# Patient Record
Sex: Female | Born: 1960
Health system: Southern US, Community
[De-identification: ages and names within clinical notes are randomized; demographics above are authoritative.]

## PROBLEM LIST (undated history)

## (undated) DIAGNOSIS — J349 Unspecified disorder of nose and nasal sinuses: Secondary | ICD-10-CM

## (undated) DIAGNOSIS — H9190 Unspecified hearing loss, unspecified ear: Secondary | ICD-10-CM

## (undated) DIAGNOSIS — F419 Anxiety disorder, unspecified: Secondary | ICD-10-CM

## (undated) DIAGNOSIS — G809 Cerebral palsy, unspecified: Secondary | ICD-10-CM

## (undated) DIAGNOSIS — F32A Depression, unspecified: Secondary | ICD-10-CM

## (undated) DIAGNOSIS — F329 Major depressive disorder, single episode, unspecified: Secondary | ICD-10-CM

## (undated) HISTORY — DX: Unspecified hearing loss, unspecified ear: H91.90

## (undated) HISTORY — PX: BRAIN SURGERY: SHX531

## (undated) HISTORY — DX: Unspecified disorder of nose and nasal sinuses: J34.9

---

## 2006-03-31 ENCOUNTER — Encounter: Admission: RE | Admit: 2006-03-31 | Discharge: 2006-03-31 | Payer: Self-pay | Admitting: Family Medicine

## 2007-04-03 ENCOUNTER — Encounter: Admission: RE | Admit: 2007-04-03 | Discharge: 2007-04-03 | Payer: Self-pay | Admitting: Family Medicine

## 2007-08-09 ENCOUNTER — Emergency Department (HOSPITAL_COMMUNITY): Admission: EM | Admit: 2007-08-09 | Discharge: 2007-08-09 | Payer: Self-pay | Admitting: Emergency Medicine

## 2007-09-06 ENCOUNTER — Encounter: Admission: RE | Admit: 2007-09-06 | Discharge: 2007-09-06 | Payer: Self-pay | Admitting: Family Medicine

## 2008-04-08 ENCOUNTER — Encounter: Admission: RE | Admit: 2008-04-08 | Discharge: 2008-04-08 | Payer: Self-pay | Admitting: Family Medicine

## 2009-04-21 ENCOUNTER — Encounter: Admission: RE | Admit: 2009-04-21 | Discharge: 2009-04-21 | Payer: Self-pay | Admitting: Internal Medicine

## 2009-04-27 ENCOUNTER — Encounter: Admission: RE | Admit: 2009-04-27 | Discharge: 2009-04-27 | Payer: Self-pay | Admitting: Internal Medicine

## 2010-04-30 ENCOUNTER — Encounter
Admission: RE | Admit: 2010-04-30 | Discharge: 2010-04-30 | Payer: Self-pay | Source: Home / Self Care | Attending: Family Medicine | Admitting: Family Medicine

## 2010-05-09 ENCOUNTER — Encounter: Payer: Self-pay | Admitting: Internal Medicine

## 2011-04-08 ENCOUNTER — Other Ambulatory Visit: Payer: Self-pay | Admitting: Family Medicine

## 2011-04-08 DIAGNOSIS — Z1231 Encounter for screening mammogram for malignant neoplasm of breast: Secondary | ICD-10-CM

## 2011-05-02 ENCOUNTER — Ambulatory Visit
Admission: RE | Admit: 2011-05-02 | Discharge: 2011-05-02 | Disposition: A | Discharge status: Home / Self Care | Payer: Medicare Other | Source: Ambulatory Visit | Attending: Family Medicine | Admitting: Family Medicine

## 2011-05-02 DIAGNOSIS — Z1231 Encounter for screening mammogram for malignant neoplasm of breast: Secondary | ICD-10-CM | POA: Diagnosis not present

## 2011-05-17 DIAGNOSIS — B351 Tinea unguium: Secondary | ICD-10-CM | POA: Diagnosis not present

## 2011-06-27 DIAGNOSIS — H906 Mixed conductive and sensorineural hearing loss, bilateral: Secondary | ICD-10-CM | POA: Diagnosis not present

## 2011-08-19 DIAGNOSIS — B351 Tinea unguium: Secondary | ICD-10-CM | POA: Diagnosis not present

## 2011-08-19 DIAGNOSIS — M79609 Pain in unspecified limb: Secondary | ICD-10-CM | POA: Diagnosis not present

## 2011-08-29 DIAGNOSIS — D239 Other benign neoplasm of skin, unspecified: Secondary | ICD-10-CM | POA: Diagnosis not present

## 2011-08-29 DIAGNOSIS — E781 Pure hyperglyceridemia: Secondary | ICD-10-CM | POA: Diagnosis not present

## 2011-08-29 DIAGNOSIS — Z23 Encounter for immunization: Secondary | ICD-10-CM | POA: Diagnosis not present

## 2011-08-29 DIAGNOSIS — L259 Unspecified contact dermatitis, unspecified cause: Secondary | ICD-10-CM | POA: Diagnosis not present

## 2011-10-05 DIAGNOSIS — T148XXA Other injury of unspecified body region, initial encounter: Secondary | ICD-10-CM | POA: Diagnosis not present

## 2011-10-05 DIAGNOSIS — B353 Tinea pedis: Secondary | ICD-10-CM | POA: Diagnosis not present

## 2011-11-18 DIAGNOSIS — B351 Tinea unguium: Secondary | ICD-10-CM | POA: Diagnosis not present

## 2011-11-18 DIAGNOSIS — M79609 Pain in unspecified limb: Secondary | ICD-10-CM | POA: Diagnosis not present

## 2012-02-10 DIAGNOSIS — K5900 Constipation, unspecified: Secondary | ICD-10-CM | POA: Diagnosis not present

## 2012-02-10 DIAGNOSIS — Z23 Encounter for immunization: Secondary | ICD-10-CM | POA: Diagnosis not present

## 2012-02-10 DIAGNOSIS — J309 Allergic rhinitis, unspecified: Secondary | ICD-10-CM | POA: Diagnosis not present

## 2012-02-24 DIAGNOSIS — B351 Tinea unguium: Secondary | ICD-10-CM | POA: Diagnosis not present

## 2012-02-24 DIAGNOSIS — M79609 Pain in unspecified limb: Secondary | ICD-10-CM | POA: Diagnosis not present

## 2012-03-27 DIAGNOSIS — H60399 Other infective otitis externa, unspecified ear: Secondary | ICD-10-CM | POA: Diagnosis not present

## 2012-04-03 ENCOUNTER — Other Ambulatory Visit: Payer: Self-pay | Admitting: Family Medicine

## 2012-04-03 DIAGNOSIS — Z1231 Encounter for screening mammogram for malignant neoplasm of breast: Secondary | ICD-10-CM

## 2012-04-20 DIAGNOSIS — E781 Pure hyperglyceridemia: Secondary | ICD-10-CM | POA: Diagnosis not present

## 2012-04-20 DIAGNOSIS — Z0000 Encounter for general adult medical examination without abnormal findings: Secondary | ICD-10-CM | POA: Diagnosis not present

## 2012-04-20 DIAGNOSIS — Z5189 Encounter for other specified aftercare: Secondary | ICD-10-CM | POA: Diagnosis not present

## 2012-04-20 DIAGNOSIS — F411 Generalized anxiety disorder: Secondary | ICD-10-CM | POA: Diagnosis not present

## 2012-04-20 DIAGNOSIS — G809 Cerebral palsy, unspecified: Secondary | ICD-10-CM | POA: Diagnosis not present

## 2012-05-01 DIAGNOSIS — Z5189 Encounter for other specified aftercare: Secondary | ICD-10-CM | POA: Diagnosis not present

## 2012-05-01 DIAGNOSIS — E781 Pure hyperglyceridemia: Secondary | ICD-10-CM | POA: Diagnosis not present

## 2012-05-01 DIAGNOSIS — Z0000 Encounter for general adult medical examination without abnormal findings: Secondary | ICD-10-CM | POA: Diagnosis not present

## 2012-05-10 ENCOUNTER — Ambulatory Visit
Admission: RE | Admit: 2012-05-10 | Discharge: 2012-05-10 | Disposition: A | Discharge status: Home / Self Care | Payer: Medicare Other | Source: Ambulatory Visit | Attending: Family Medicine | Admitting: Family Medicine

## 2012-05-10 DIAGNOSIS — Z1231 Encounter for screening mammogram for malignant neoplasm of breast: Secondary | ICD-10-CM | POA: Diagnosis not present

## 2012-05-28 DIAGNOSIS — H109 Unspecified conjunctivitis: Secondary | ICD-10-CM | POA: Diagnosis not present

## 2012-05-28 DIAGNOSIS — L259 Unspecified contact dermatitis, unspecified cause: Secondary | ICD-10-CM | POA: Diagnosis not present

## 2012-06-08 DIAGNOSIS — G809 Cerebral palsy, unspecified: Secondary | ICD-10-CM | POA: Diagnosis not present

## 2012-06-08 DIAGNOSIS — F411 Generalized anxiety disorder: Secondary | ICD-10-CM | POA: Diagnosis not present

## 2012-06-12 DIAGNOSIS — B351 Tinea unguium: Secondary | ICD-10-CM | POA: Diagnosis not present

## 2012-06-12 DIAGNOSIS — M79609 Pain in unspecified limb: Secondary | ICD-10-CM | POA: Diagnosis not present

## 2012-09-25 DIAGNOSIS — H906 Mixed conductive and sensorineural hearing loss, bilateral: Secondary | ICD-10-CM | POA: Diagnosis not present

## 2012-09-25 DIAGNOSIS — H6120 Impacted cerumen, unspecified ear: Secondary | ICD-10-CM | POA: Diagnosis not present

## 2012-10-02 DIAGNOSIS — M79609 Pain in unspecified limb: Secondary | ICD-10-CM | POA: Diagnosis not present

## 2012-10-02 DIAGNOSIS — B351 Tinea unguium: Secondary | ICD-10-CM | POA: Diagnosis not present

## 2012-10-30 DIAGNOSIS — Z13220 Encounter for screening for lipoid disorders: Secondary | ICD-10-CM | POA: Diagnosis not present

## 2012-10-30 DIAGNOSIS — Z124 Encounter for screening for malignant neoplasm of cervix: Secondary | ICD-10-CM | POA: Diagnosis not present

## 2012-10-31 DIAGNOSIS — H109 Unspecified conjunctivitis: Secondary | ICD-10-CM | POA: Diagnosis not present

## 2012-10-31 DIAGNOSIS — H04559 Acquired stenosis of unspecified nasolacrimal duct: Secondary | ICD-10-CM | POA: Diagnosis not present

## 2013-01-01 DIAGNOSIS — B351 Tinea unguium: Secondary | ICD-10-CM | POA: Diagnosis not present

## 2013-01-01 DIAGNOSIS — M79609 Pain in unspecified limb: Secondary | ICD-10-CM | POA: Diagnosis not present

## 2013-01-02 ENCOUNTER — Other Ambulatory Visit: Payer: Self-pay

## 2013-01-02 DIAGNOSIS — Z1231 Encounter for screening mammogram for malignant neoplasm of breast: Secondary | ICD-10-CM

## 2013-02-06 DIAGNOSIS — Z23 Encounter for immunization: Secondary | ICD-10-CM | POA: Diagnosis not present

## 2013-04-02 ENCOUNTER — Ambulatory Visit: Payer: Medicare Other

## 2013-04-30 ENCOUNTER — Ambulatory Visit (INDEPENDENT_AMBULATORY_CARE_PROVIDER_SITE_OTHER): Payer: Medicare Other

## 2013-04-30 VITALS — BP 138/95 | HR 95 | Resp 16 | Ht <= 58 in

## 2013-04-30 DIAGNOSIS — B351 Tinea unguium: Secondary | ICD-10-CM

## 2013-04-30 DIAGNOSIS — G629 Polyneuropathy, unspecified: Secondary | ICD-10-CM

## 2013-04-30 DIAGNOSIS — G609 Hereditary and idiopathic neuropathy, unspecified: Secondary | ICD-10-CM | POA: Diagnosis not present

## 2013-04-30 DIAGNOSIS — M79609 Pain in unspecified limb: Secondary | ICD-10-CM | POA: Diagnosis not present

## 2013-04-30 DIAGNOSIS — S90129A Contusion of unspecified lesser toe(s) without damage to nail, initial encounter: Secondary | ICD-10-CM

## 2013-04-30 DIAGNOSIS — M2040 Other hammer toe(s) (acquired), unspecified foot: Secondary | ICD-10-CM

## 2013-04-30 NOTE — Patient Instructions (Signed)

## 2013-04-30 NOTE — Progress Notes (Signed)
   Subjective:    Patient ID: Tamara Curry, female    DOB: 12/26/1960, 53 y.o.   MRN: [id number]  HPI Comments: i got a toenail that is coming off on my right foot, its the 4th toenail.     Review of Systems no new changes rotates at this time. 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 Patient does have spasms and tremors of both lower extremities is nonambulatory and wheelchair. Nails somewhat thick brittle discolored and friable 1 through 5 bilateral however more significant fourth digit right foot demonstrates an separation from the nail bed or contusion to the toe the nail plate is been almost completely ripped loose from the nailbed on the fourth toe there is some dried blood no active discharge drainage no purulence no ascending psoas lymphangitis is noted. Remaining nails thickened yellow brittle discolored friable consistent with onychomycosis.       Assessment & Plan:  Assessment this time is contusion of toe and nail plate fourth right as well as onychomycosis proptosis of remaining nails 1 through 5 bilateral. Painful mycotic nails debrided 1 through 5 bilateral and partial avulsion of the fourth toe is is done as nails completely separate from the nailbed. Is debrided cleansed with all cleansed Neosporin and Band-Aid dressing applied to the fourth toe right foot. Recommended palliative care in the future and as-needed basis suggest to 3 month followup for nail care in the future also recommended loosefitting slippers or  sandals consider things like a UGGS a booster sandals to accommodate the foot and protecting keep it warm she does not need a functional shoes she is not a candidate for surgical intervention for correction of bunion and hammertoes based on her neuropathy and paresthesias. Reappointed future palliative care is needed  Harriet Masson DPM

## 2013-05-13 ENCOUNTER — Ambulatory Visit: Payer: Medicare Other

## 2013-05-13 DIAGNOSIS — F411 Generalized anxiety disorder: Secondary | ICD-10-CM | POA: Diagnosis not present

## 2013-05-13 DIAGNOSIS — E781 Pure hyperglyceridemia: Secondary | ICD-10-CM | POA: Diagnosis not present

## 2013-05-13 DIAGNOSIS — Z79899 Other long term (current) drug therapy: Secondary | ICD-10-CM | POA: Diagnosis not present

## 2013-05-13 DIAGNOSIS — Z0000 Encounter for general adult medical examination without abnormal findings: Secondary | ICD-10-CM | POA: Diagnosis not present

## 2013-05-13 DIAGNOSIS — G809 Cerebral palsy, unspecified: Secondary | ICD-10-CM | POA: Diagnosis not present

## 2013-05-16 DIAGNOSIS — F411 Generalized anxiety disorder: Secondary | ICD-10-CM | POA: Diagnosis not present

## 2013-05-16 DIAGNOSIS — Z79899 Other long term (current) drug therapy: Secondary | ICD-10-CM | POA: Diagnosis not present

## 2013-05-16 DIAGNOSIS — R269 Unspecified abnormalities of gait and mobility: Secondary | ICD-10-CM | POA: Diagnosis not present

## 2013-05-16 DIAGNOSIS — M6281 Muscle weakness (generalized): Secondary | ICD-10-CM | POA: Diagnosis not present

## 2013-05-16 DIAGNOSIS — G809 Cerebral palsy, unspecified: Secondary | ICD-10-CM | POA: Diagnosis not present

## 2013-05-16 DIAGNOSIS — Z5189 Encounter for other specified aftercare: Secondary | ICD-10-CM | POA: Diagnosis not present

## 2013-05-20 DIAGNOSIS — G809 Cerebral palsy, unspecified: Secondary | ICD-10-CM | POA: Diagnosis not present

## 2013-05-20 DIAGNOSIS — M6281 Muscle weakness (generalized): Secondary | ICD-10-CM | POA: Diagnosis not present

## 2013-05-20 DIAGNOSIS — F411 Generalized anxiety disorder: Secondary | ICD-10-CM | POA: Diagnosis not present

## 2013-05-20 DIAGNOSIS — Z5189 Encounter for other specified aftercare: Secondary | ICD-10-CM | POA: Diagnosis not present

## 2013-05-20 DIAGNOSIS — Z79899 Other long term (current) drug therapy: Secondary | ICD-10-CM | POA: Diagnosis not present

## 2013-05-20 DIAGNOSIS — R269 Unspecified abnormalities of gait and mobility: Secondary | ICD-10-CM | POA: Diagnosis not present

## 2013-05-22 DIAGNOSIS — Z5189 Encounter for other specified aftercare: Secondary | ICD-10-CM | POA: Diagnosis not present

## 2013-05-22 DIAGNOSIS — F411 Generalized anxiety disorder: Secondary | ICD-10-CM | POA: Diagnosis not present

## 2013-05-22 DIAGNOSIS — R269 Unspecified abnormalities of gait and mobility: Secondary | ICD-10-CM | POA: Diagnosis not present

## 2013-05-22 DIAGNOSIS — Z79899 Other long term (current) drug therapy: Secondary | ICD-10-CM | POA: Diagnosis not present

## 2013-05-22 DIAGNOSIS — G809 Cerebral palsy, unspecified: Secondary | ICD-10-CM | POA: Diagnosis not present

## 2013-05-22 DIAGNOSIS — M6281 Muscle weakness (generalized): Secondary | ICD-10-CM | POA: Diagnosis not present

## 2013-05-24 DIAGNOSIS — Z79899 Other long term (current) drug therapy: Secondary | ICD-10-CM | POA: Diagnosis not present

## 2013-05-24 DIAGNOSIS — R269 Unspecified abnormalities of gait and mobility: Secondary | ICD-10-CM | POA: Diagnosis not present

## 2013-05-24 DIAGNOSIS — M6281 Muscle weakness (generalized): Secondary | ICD-10-CM | POA: Diagnosis not present

## 2013-05-24 DIAGNOSIS — Z5189 Encounter for other specified aftercare: Secondary | ICD-10-CM | POA: Diagnosis not present

## 2013-05-24 DIAGNOSIS — G809 Cerebral palsy, unspecified: Secondary | ICD-10-CM | POA: Diagnosis not present

## 2013-05-24 DIAGNOSIS — F411 Generalized anxiety disorder: Secondary | ICD-10-CM | POA: Diagnosis not present

## 2013-05-27 DIAGNOSIS — Z5189 Encounter for other specified aftercare: Secondary | ICD-10-CM | POA: Diagnosis not present

## 2013-05-27 DIAGNOSIS — F411 Generalized anxiety disorder: Secondary | ICD-10-CM | POA: Diagnosis not present

## 2013-05-27 DIAGNOSIS — M6281 Muscle weakness (generalized): Secondary | ICD-10-CM | POA: Diagnosis not present

## 2013-05-27 DIAGNOSIS — Z79899 Other long term (current) drug therapy: Secondary | ICD-10-CM | POA: Diagnosis not present

## 2013-05-27 DIAGNOSIS — R269 Unspecified abnormalities of gait and mobility: Secondary | ICD-10-CM | POA: Diagnosis not present

## 2013-05-27 DIAGNOSIS — G809 Cerebral palsy, unspecified: Secondary | ICD-10-CM | POA: Diagnosis not present

## 2013-05-30 DIAGNOSIS — M6281 Muscle weakness (generalized): Secondary | ICD-10-CM | POA: Diagnosis not present

## 2013-05-30 DIAGNOSIS — Z79899 Other long term (current) drug therapy: Secondary | ICD-10-CM | POA: Diagnosis not present

## 2013-05-30 DIAGNOSIS — Z5189 Encounter for other specified aftercare: Secondary | ICD-10-CM | POA: Diagnosis not present

## 2013-05-30 DIAGNOSIS — R269 Unspecified abnormalities of gait and mobility: Secondary | ICD-10-CM | POA: Diagnosis not present

## 2013-05-30 DIAGNOSIS — F411 Generalized anxiety disorder: Secondary | ICD-10-CM | POA: Diagnosis not present

## 2013-05-30 DIAGNOSIS — G809 Cerebral palsy, unspecified: Secondary | ICD-10-CM | POA: Diagnosis not present

## 2013-05-31 DIAGNOSIS — G809 Cerebral palsy, unspecified: Secondary | ICD-10-CM | POA: Diagnosis not present

## 2013-05-31 DIAGNOSIS — M6281 Muscle weakness (generalized): Secondary | ICD-10-CM | POA: Diagnosis not present

## 2013-05-31 DIAGNOSIS — Z5189 Encounter for other specified aftercare: Secondary | ICD-10-CM | POA: Diagnosis not present

## 2013-05-31 DIAGNOSIS — F411 Generalized anxiety disorder: Secondary | ICD-10-CM | POA: Diagnosis not present

## 2013-05-31 DIAGNOSIS — Z79899 Other long term (current) drug therapy: Secondary | ICD-10-CM | POA: Diagnosis not present

## 2013-05-31 DIAGNOSIS — R269 Unspecified abnormalities of gait and mobility: Secondary | ICD-10-CM | POA: Diagnosis not present

## 2013-06-06 DIAGNOSIS — R269 Unspecified abnormalities of gait and mobility: Secondary | ICD-10-CM | POA: Diagnosis not present

## 2013-06-06 DIAGNOSIS — F411 Generalized anxiety disorder: Secondary | ICD-10-CM | POA: Diagnosis not present

## 2013-06-06 DIAGNOSIS — Z5189 Encounter for other specified aftercare: Secondary | ICD-10-CM | POA: Diagnosis not present

## 2013-06-06 DIAGNOSIS — M6281 Muscle weakness (generalized): Secondary | ICD-10-CM | POA: Diagnosis not present

## 2013-06-06 DIAGNOSIS — G809 Cerebral palsy, unspecified: Secondary | ICD-10-CM | POA: Diagnosis not present

## 2013-06-06 DIAGNOSIS — Z79899 Other long term (current) drug therapy: Secondary | ICD-10-CM | POA: Diagnosis not present

## 2013-06-07 DIAGNOSIS — Z5189 Encounter for other specified aftercare: Secondary | ICD-10-CM | POA: Diagnosis not present

## 2013-06-07 DIAGNOSIS — M6281 Muscle weakness (generalized): Secondary | ICD-10-CM | POA: Diagnosis not present

## 2013-06-07 DIAGNOSIS — F411 Generalized anxiety disorder: Secondary | ICD-10-CM | POA: Diagnosis not present

## 2013-06-07 DIAGNOSIS — G809 Cerebral palsy, unspecified: Secondary | ICD-10-CM | POA: Diagnosis not present

## 2013-06-07 DIAGNOSIS — Z79899 Other long term (current) drug therapy: Secondary | ICD-10-CM | POA: Diagnosis not present

## 2013-06-07 DIAGNOSIS — R269 Unspecified abnormalities of gait and mobility: Secondary | ICD-10-CM | POA: Diagnosis not present

## 2013-06-24 ENCOUNTER — Ambulatory Visit
Admission: RE | Admit: 2013-06-24 | Discharge: 2013-06-24 | Disposition: A | Discharge status: Home / Self Care | Payer: Medicare Other | Source: Ambulatory Visit

## 2013-06-24 DIAGNOSIS — Z1231 Encounter for screening mammogram for malignant neoplasm of breast: Secondary | ICD-10-CM

## 2013-07-23 ENCOUNTER — Ambulatory Visit: Payer: Medicare Other

## 2013-07-31 DIAGNOSIS — F329 Major depressive disorder, single episode, unspecified: Secondary | ICD-10-CM | POA: Diagnosis not present

## 2013-07-31 DIAGNOSIS — F70 Mild intellectual disabilities: Secondary | ICD-10-CM | POA: Diagnosis not present

## 2013-07-31 DIAGNOSIS — F3289 Other specified depressive episodes: Secondary | ICD-10-CM | POA: Diagnosis not present

## 2013-07-31 DIAGNOSIS — F411 Generalized anxiety disorder: Secondary | ICD-10-CM | POA: Diagnosis not present

## 2013-08-27 ENCOUNTER — Ambulatory Visit (INDEPENDENT_AMBULATORY_CARE_PROVIDER_SITE_OTHER): Payer: Medicare Other

## 2013-08-27 VITALS — BP 122/78 | HR 75 | Resp 16

## 2013-08-27 DIAGNOSIS — B351 Tinea unguium: Secondary | ICD-10-CM | POA: Diagnosis not present

## 2013-08-27 DIAGNOSIS — G629 Polyneuropathy, unspecified: Secondary | ICD-10-CM

## 2013-08-27 DIAGNOSIS — G609 Hereditary and idiopathic neuropathy, unspecified: Secondary | ICD-10-CM

## 2013-08-27 DIAGNOSIS — M79609 Pain in unspecified limb: Secondary | ICD-10-CM | POA: Diagnosis not present

## 2013-08-27 NOTE — Progress Notes (Signed)
   Subjective:    Patient ID: Tamara Curry, female    DOB: 03-12-1961, 53 y.o.   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 Patient does have thick brittle discolored nails with ecchymosis of the left hallux nail bed due to the overlapping second toe sitting on top the nailbed. Nails somewhat loose and yellowed and friable brittle and discolored consistent with onychomycosis in kyphosis of nails.       Assessment & Plan:  Assessment onychomycosis the nails history of contusion the toes hallux left mini nails also some thickening yellowing discoloration and friability at this time painful mycotic nails debrided and presence of pain symptomatology in a history of contusions maintain accommodative comfortable shoes return in 3 months for continued mycotic in palliative nail care  Harriet Masson DPM

## 2013-08-27 NOTE — Patient Instructions (Signed)

## 2013-09-27 DIAGNOSIS — H906 Mixed conductive and sensorineural hearing loss, bilateral: Secondary | ICD-10-CM | POA: Diagnosis not present

## 2013-10-07 DIAGNOSIS — H6120 Impacted cerumen, unspecified ear: Secondary | ICD-10-CM | POA: Diagnosis not present

## 2013-10-25 ENCOUNTER — Encounter (HOSPITAL_COMMUNITY): Payer: Self-pay | Admitting: Emergency Medicine

## 2013-10-25 ENCOUNTER — Emergency Department (HOSPITAL_COMMUNITY)
Admission: EM | Admit: 2013-10-25 | Discharge: 2013-10-25 | Disposition: A | Discharge status: Home / Self Care | Payer: Medicare Other | Attending: Emergency Medicine | Admitting: Emergency Medicine

## 2013-10-25 ENCOUNTER — Emergency Department (HOSPITAL_COMMUNITY): Payer: Medicare Other

## 2013-10-25 DIAGNOSIS — R197 Diarrhea, unspecified: Secondary | ICD-10-CM | POA: Diagnosis not present

## 2013-10-25 DIAGNOSIS — Z8709 Personal history of other diseases of the respiratory system: Secondary | ICD-10-CM | POA: Diagnosis not present

## 2013-10-25 DIAGNOSIS — Z880 Allergy status to penicillin: Secondary | ICD-10-CM | POA: Diagnosis not present

## 2013-10-25 DIAGNOSIS — Z79899 Other long term (current) drug therapy: Secondary | ICD-10-CM | POA: Diagnosis not present

## 2013-10-25 DIAGNOSIS — H9190 Unspecified hearing loss, unspecified ear: Secondary | ICD-10-CM | POA: Diagnosis not present

## 2013-10-25 DIAGNOSIS — R112 Nausea with vomiting, unspecified: Secondary | ICD-10-CM | POA: Insufficient documentation

## 2013-10-25 DIAGNOSIS — K5730 Diverticulosis of large intestine without perforation or abscess without bleeding: Secondary | ICD-10-CM | POA: Insufficient documentation

## 2013-10-25 LAB — CBC WITH DIFFERENTIAL/PLATELET
BASOS ABS: 0 10*3/uL (ref 0.0–0.1)
BASOS PCT: 0 % (ref 0–1)
EOS PCT: 1 % (ref 0–5)
Eosinophils Absolute: 0.1 10*3/uL (ref 0.0–0.7)
HCT: 44.1 % (ref 36.0–46.0)
HEMOGLOBIN: 15.6 g/dL — AB (ref 12.0–15.0)
LYMPHS PCT: 2 % — AB (ref 12–46)
Lymphs Abs: 0.5 10*3/uL — ABNORMAL LOW (ref 0.7–4.0)
MCH: 30.2 pg (ref 26.0–34.0)
MCHC: 35.4 g/dL (ref 30.0–36.0)
MCV: 85.5 fL (ref 78.0–100.0)
MONO ABS: 1 10*3/uL (ref 0.1–1.0)
Monocytes Relative: 5 % (ref 3–12)
NEUTROS PCT: 92 % — AB (ref 43–77)
Neutro Abs: 19.1 10*3/uL — ABNORMAL HIGH (ref 1.7–7.7)
PLATELETS: 331 10*3/uL (ref 150–400)
RBC: 5.16 MIL/uL — ABNORMAL HIGH (ref 3.87–5.11)
RDW: 12 % (ref 11.5–15.5)
WBC: 20.8 10*3/uL — AB (ref 4.0–10.5)

## 2013-10-25 LAB — COMPREHENSIVE METABOLIC PANEL
ALK PHOS: 113 U/L (ref 39–117)
ALT: 24 U/L (ref 0–35)
AST: 23 U/L (ref 0–37)
Albumin: 3.7 g/dL (ref 3.5–5.2)
Anion gap: 15 (ref 5–15)
BUN: 13 mg/dL (ref 6–23)
CHLORIDE: 105 meq/L (ref 96–112)
CO2: 21 mEq/L (ref 19–32)
CREATININE: 0.62 mg/dL (ref 0.50–1.10)
Calcium: 10.2 mg/dL (ref 8.4–10.5)
GFR calc non Af Amer: >90 mL/min (ref >90)
Glucose, Bld: 170 mg/dL — ABNORMAL HIGH (ref 70–99)
Potassium: 4.2 mEq/L (ref 3.7–5.3)
Sodium: 141 mEq/L (ref 137–147)
TOTAL PROTEIN: 7.6 g/dL (ref 6.0–8.3)
Total Bilirubin: 0.5 mg/dL (ref 0.3–1.2)

## 2013-10-25 LAB — URINALYSIS, ROUTINE W REFLEX MICROSCOPIC
Bilirubin Urine: NEGATIVE
GLUCOSE, UA: NEGATIVE mg/dL
KETONES UR: NEGATIVE mg/dL
Leukocytes, UA: NEGATIVE
NITRITE: NEGATIVE
PH: 5 (ref 5.0–8.0)
PROTEIN: NEGATIVE mg/dL
SPECIFIC GRAVITY, URINE: 1.022 (ref 1.005–1.030)
UROBILINOGEN UA: 0.2 mg/dL (ref 0.0–1.0)

## 2013-10-25 LAB — URINE MICROSCOPIC-ADD ON

## 2013-10-25 MED ORDER — ONDANSETRON HCL 4 MG PO TABS: 4.0000 mg | ORAL_TABLET | Freq: Four times a day (QID) | ORAL | Status: DC

## 2013-10-25 MED ORDER — ONDANSETRON HCL 4 MG/2ML IJ SOLN
4.0000 mg | Freq: Once | INTRAMUSCULAR | Status: AC
  Administered 2013-10-25: 4 mg via INTRAVENOUS
  Filled 2013-10-25: qty 2

## 2013-10-25 MED ORDER — METOCLOPRAMIDE HCL 5 MG/ML IJ SOLN
10.0000 mg | Freq: Once | INTRAMUSCULAR | Status: AC
  Administered 2013-10-25: 10 mg via INTRAVENOUS
  Filled 2013-10-25: qty 2

## 2013-10-25 MED ORDER — IOHEXOL 300 MG/ML  SOLN
50.0000 mL | Freq: Once | INTRAMUSCULAR | Status: AC | PRN
  Administered 2013-10-25: 50 mL via ORAL

## 2013-10-25 MED ORDER — IOHEXOL 300 MG/ML  SOLN
80.0000 mL | Freq: Once | INTRAMUSCULAR | Status: AC | PRN
  Administered 2013-10-25: 80 mL via INTRAVENOUS

## 2013-10-25 MED ORDER — SODIUM CHLORIDE 0.9 % IV BOLUS (SEPSIS)
1000.0000 mL | Freq: Once | INTRAVENOUS | Status: AC
  Administered 2013-10-25: 1000 mL via INTRAVENOUS

## 2013-10-25 NOTE — ED Notes (Signed)
Pt's caregiver is Marylu Lund [phone]. Please call with updates.

## 2013-10-25 NOTE — ED Notes (Signed)
Patient is resting comfortably. 

## 2013-10-25 NOTE — ED Provider Notes (Signed)
CSN: [id number]     Arrival date & time 10/25/13  0531 History   First MD Initiated Contact with Patient 10/25/13 [phone]     Chief Complaint  Patient presents with  . Nausea  . Emesis  . Diarrhea     (Consider location/radiation/quality/duration/timing/severity/associated sxs/prior Treatment) The history is provided by the patient and medical records.   This is a 53 year old female presenting to the ED with caregiver for nausea, vomiting, and diarrhea beginning at 0300.  History limited as patient is largely non-communicative, can answer yes or no to simple questions.  Per home caregiver, patient has had multiple episodes of nonbloody, nonbilious emesis and watery diarrhea this morning. States it resolved for a brief period, but when attempting to drink water symptoms return. She denies any recent fever, travel, change in diet, or antibiotic use. Patient currently denies any abdominal pain.  Has hx of diverticulosis.  No prior abdominal surgeries.  Vital signs stable on arrival.  Past Medical History  Diagnosis Date  . Sinus disorder   . Hearing loss    Past Surgical History  Procedure Laterality Date  . Brain surgery     No family history on file. History  Substance Use Topics  . Smoking status: Never Smoker   . Smokeless tobacco: Never Used  . Alcohol Use: 0.6 oz/week    1 Shots of liquor per week   OB History   Grav Para Term Preterm Abortions TAB SAB Ect Mult Living                 Review of Systems  Gastrointestinal: Positive for nausea, vomiting and diarrhea.  All other systems reviewed and are negative.     Allergies  Penicillins  Home Medications   Prior to Admission medications   Medication Sig Start Date End Date Taking? Authorizing Provider  Calcium Carb-Cholecalciferol (CALCIUM 600 + D PO) Take by mouth 2 (two) times daily.   Yes Historical Provider, MD  clonazePAM (KLONOPIN) 0.5 MG tablet Take 0.5 mg by mouth 2 (two) times daily as needed for anxiety.    Yes Historical Provider, MD  diphenhydrAMINE (SOMINEX) 25 MG tablet Take 25 mg by mouth 2 (two) times daily.   Yes Historical Provider, MD  ibuprofen (ADVIL,MOTRIN) 600 MG tablet Take 600 mg by mouth every 6 (six) hours as needed.   Yes Historical Provider, MD  Multiple Vitamin (MULTIVITAMIN WITH MINERALS) TABS tablet Take 1 tablet by mouth daily.   Yes Historical Provider, MD  Omega-3 Fatty Acids (FISH OIL PO) Take by mouth daily.   Yes Historical Provider, MD  PARoxetine (PAXIL) 10 MG tablet Take 20 mg by mouth daily.    Yes Historical Provider, MD  venlafaxine (EFFEXOR) 75 MG tablet Take 75 mg by mouth daily.   Yes Historical Provider, MD   BP 123/90  Pulse 109  Temp(Src) 98.4 F (36.9 C) (Oral)  Resp 18  Ht 4\' 6"  (1.372 m)  Wt 119 lb (53.978 kg)  BMI 28.68 kg/m2  SpO2 95%  Physical Exam  Nursing note and vitals reviewed. Constitutional: She is oriented to person, place, and time. She appears well-developed and well-nourished. No distress.  HENT:  Head: Normocephalic and atraumatic.  Mouth/Throat: Oropharynx is clear and moist.  Mildly dry mucous membranes  Eyes: Conjunctivae and EOM are normal. Pupils are equal, round, and reactive to light.  Neck: Normal range of motion. Neck supple.  Cardiovascular: Normal rate, regular rhythm and normal heart sounds.   Pulmonary/Chest: Effort normal and breath  sounds normal. No respiratory distress. She has no wheezes.  Abdominal: Soft. Bowel sounds are normal. There is no tenderness. There is no guarding and no CVA tenderness.  Abdomen soft, non-distended, no wincing with light or deep palpation; no focal tenderness or peritoneal signs  Musculoskeletal: Normal range of motion.  Neurological: She is alert and oriented to person, place, and time.  Baseline oriented per nursing aid; speech difficult to understand, answers "yes" and "no" to most questions  Skin: Skin is warm and dry. She is not diaphoretic.  Psychiatric: She has a normal mood  and affect.    ED Course  Procedures (including critical care time) Labs Review Labs Reviewed  CBC WITH DIFFERENTIAL - Abnormal; Notable for the following:    WBC 20.8 (*)    RBC 5.16 (*)    Hemoglobin 15.6 (*)    Neutrophils Relative % 92 (*)    Neutro Abs 19.1 (*)    Lymphocytes Relative 2 (*)    Lymphs Abs 0.5 (*)    All other components within normal limits  COMPREHENSIVE METABOLIC PANEL - Abnormal; Notable for the following:    Glucose, Bld 170 (*)    All other components within normal limits  URINE CULTURE  URINALYSIS, ROUTINE W REFLEX MICROSCOPIC    Imaging Review Ct Abdomen Pelvis W Contrast  10/25/2013   CLINICAL DATA:  Vomiting and diarrhea.  Nausea.  EXAM: CT ABDOMEN AND PELVIS WITH CONTRAST  TECHNIQUE: Multidetector CT imaging of the abdomen and pelvis was performed using the standard protocol following bolus administration of intravenous contrast.  CONTRAST:  72mL OMNIPAQUE IOHEXOL 300 MG/ML  SOLN  COMPARISON:  Lumbar spine radiographs 09/06/2007  FINDINGS: The lung bases demonstrate mild dependent atelectasis bilaterally. The heart size is normal. No significant pleural or pericardial effusion is present.  The liver demonstrates borderline fatty infiltration. No focal lesions are present. The spleen is within normal limits. The stomach, duodenum, and pancreas are unremarkable. The common bile duct is within normal limits. The gallbladder is distended but without inflation inflammation. Adrenal glands are normal bilaterally. The kidneys and ureters are within normal limits. The urinary bladder is unremarkable.  Fluid is present in the rectum. There is no significant proximal dilation. Diverticular changes are present and descending and proximal sigmoid colon. No focal inflammation is evident. More proximal colon is within normal limits. The appendix is visualized and normal. Small bowel is unremarkable. Uterus and adnexa are normal for age.  Rightward curvature of the upper  lumbar spine is noted. Vertebral body heights and alignment are maintained. Mild facet degenerative changes are present at L4-5 and to a greater extent L5-S1.  IMPRESSION: 1. Fluid within the rectum and distal sigmoid. This corresponds with the patient's diarrhea. 2. Fluid in the distal sigmoid colon rectum is likely related to diarrhea. 3. Sigmoid diverticulosis without evidence for diverticulitis.   Electronically Signed   By: Lawrence Santiago M.D.   On: 10/25/2013 10:12     EKG Interpretation None      MDM   Final diagnoses:  Nausea vomiting and diarrhea   53 year old largely noncommunicative female presenting with nausea, vomiting, diarrhea onset 3 AM.  Per nursing aid, all episodes have been nonbloody and this.  On arrival, patient is afebrile and overall nontoxic appearing clear her abdominal exam is benign. Will obtain basic labs.  Given IVFB and zofran.  Will monitor closely.  Labs with significant leukocytosis of 20.8. No significant electrolyte imbalance. Will obtain CT abdomen pelvis with contrast for further  evaluation, specifically to rule out acute diverticulitis.  Patient attempted to drink contrast and began vomiting. She was given dose of reglan.  CT abdomen and pelvis with findings consistent with patient's known diarrhea, no evidence of acute diverticulitis.  Patient remains afebrile and nontoxic appearing. Her abdominal exam remains benign. She has been rehydrated and has tolerated PO in the ED without difficulty or recurrent vomiting. She will be discharged home with Zofran and started on supportive care. She will follow with her primary care physician.  Discussed plan with patient, he/she acknowledged understanding and agreed with plan of care.  Return precautions given for new or worsening symptoms.  Larene Pickett, PA-C 10/25/13 1338

## 2013-10-25 NOTE — ED Notes (Signed)
Patient started vomiting at three in the morning. She can not keep anything down. Patient says she is having diarrhea also. All the symptoms started around 3am.

## 2013-10-25 NOTE — ED Notes (Signed)
Patient transported to CT 

## 2013-10-25 NOTE — Discharge Instructions (Signed)
Take the prescribed medication as directed. °Follow-up with your primary care physician. °Return to the ED for new or worsening symptoms. ° °

## 2013-10-25 NOTE — ED Notes (Signed)
Pt started drinking contrast for CT and immediately afterwards had 2 episodes of projectile vomiting. PA Lattie Haw notified, order for reglan received.

## 2013-10-26 LAB — URINE CULTURE
Colony Count: NO GROWTH
Culture: NO GROWTH

## 2013-10-26 NOTE — ED Provider Notes (Signed)
Medical screening examination/treatment/procedure(s) were performed by non-physician practitioner and as supervising physician I was immediately available for consultation/collaboration.   EKG Interpretation None       Kalman Drape, MD 10/26/13 [phone]

## 2013-10-28 DIAGNOSIS — K5289 Other specified noninfective gastroenteritis and colitis: Secondary | ICD-10-CM | POA: Diagnosis not present

## 2013-11-27 DIAGNOSIS — F329 Major depressive disorder, single episode, unspecified: Secondary | ICD-10-CM | POA: Diagnosis not present

## 2013-11-27 DIAGNOSIS — F3289 Other specified depressive episodes: Secondary | ICD-10-CM | POA: Diagnosis not present

## 2013-11-27 DIAGNOSIS — F411 Generalized anxiety disorder: Secondary | ICD-10-CM | POA: Diagnosis not present

## 2013-11-27 DIAGNOSIS — F70 Mild intellectual disabilities: Secondary | ICD-10-CM | POA: Diagnosis not present

## 2013-12-03 ENCOUNTER — Ambulatory Visit (INDEPENDENT_AMBULATORY_CARE_PROVIDER_SITE_OTHER): Payer: Medicare Other

## 2013-12-03 DIAGNOSIS — M79673 Pain in unspecified foot: Secondary | ICD-10-CM

## 2013-12-03 DIAGNOSIS — M79609 Pain in unspecified limb: Secondary | ICD-10-CM

## 2013-12-03 DIAGNOSIS — G609 Hereditary and idiopathic neuropathy, unspecified: Secondary | ICD-10-CM

## 2013-12-03 DIAGNOSIS — M2040 Other hammer toe(s) (acquired), unspecified foot: Secondary | ICD-10-CM

## 2013-12-03 DIAGNOSIS — B351 Tinea unguium: Secondary | ICD-10-CM

## 2013-12-03 DIAGNOSIS — G629 Polyneuropathy, unspecified: Secondary | ICD-10-CM

## 2013-12-03 DIAGNOSIS — S90122D Contusion of left lesser toe(s) without damage to nail, subsequent encounter: Secondary | ICD-10-CM

## 2013-12-03 DIAGNOSIS — M2010 Hallux valgus (acquired), unspecified foot: Secondary | ICD-10-CM

## 2013-12-03 NOTE — Progress Notes (Signed)
   Subjective:    Patient ID: Tamara Curry, female    DOB: 03-19-1961, 53 y.o.   MRN: [id number]  HPI Comments: Pt states her left 1st toenail fell off, and they have been covering with a bandaid.  Pt request cutting of all toenails.  I told her we would also smooth the left 1st toenail to make it more comfortable beneath the 2nd toe.     Review of Systems no new findings or systemic changes noted patient is nonambulatory and wheelchair is a severe contractures of toes and digits with HAV deformity and hammertoe deformities     Objective:   Physical Exam  Lower extremity objective findings as follows vascular status is intact pedal pulses palpable DP post-ORIF 4. PT one over 4 bilateral. Capillary refill time 3 seconds all digits nails thick criptotic incurvated friable overlapping digits of the hallux and second toes in particular with severe HAV deformity and hammertoe deformities. Nails thick brittle crumbly friable left hallux nail is partially almost completely almost and debridement leg the remaining nails show thickening yellowing discoloration and friability tenderness on palpation and ambulation. No open wounds or ulcers secondary infections no      Assessment & Plan:  Assessment this time onychomycosis painful mycotic nails x9 debridement at this time 1 through 5 right 2 through 5 left the left hallux examined: Is not a card debridement as it is likely separator although avulsed on its own patient will continue monitor recheck in 3 months for continued palliative care in the future as needed next  Followup in 3 months  Harriet Masson DPM

## 2013-12-03 NOTE — Patient Instructions (Signed)

## 2014-01-23 DIAGNOSIS — Z23 Encounter for immunization: Secondary | ICD-10-CM | POA: Diagnosis not present

## 2014-03-05 DIAGNOSIS — F70 Mild intellectual disabilities: Secondary | ICD-10-CM | POA: Diagnosis not present

## 2014-03-05 DIAGNOSIS — F348 Other persistent mood [affective] disorders: Secondary | ICD-10-CM | POA: Diagnosis not present

## 2014-03-05 DIAGNOSIS — F411 Generalized anxiety disorder: Secondary | ICD-10-CM | POA: Diagnosis not present

## 2014-03-05 DIAGNOSIS — F918 Other conduct disorders: Secondary | ICD-10-CM | POA: Diagnosis not present

## 2014-03-11 ENCOUNTER — Ambulatory Visit: Payer: Medicare Other

## 2014-03-25 ENCOUNTER — Ambulatory Visit (INDEPENDENT_AMBULATORY_CARE_PROVIDER_SITE_OTHER): Payer: Medicare Other

## 2014-03-25 ENCOUNTER — Ambulatory Visit: Payer: Medicare Other

## 2014-03-25 VITALS — BP 148/69 | HR 100 | Resp 12

## 2014-03-25 DIAGNOSIS — M79673 Pain in unspecified foot: Secondary | ICD-10-CM

## 2014-03-25 DIAGNOSIS — B351 Tinea unguium: Secondary | ICD-10-CM

## 2014-03-25 DIAGNOSIS — G629 Polyneuropathy, unspecified: Secondary | ICD-10-CM

## 2014-03-25 NOTE — Progress Notes (Signed)
   Subjective:    Patient ID: Tamara Curry, female    DOB: [birth year], 53 y.o.   MRN: [id number]  HPI  TOENAILS TRIM.  Review of Systems no new findings or systemic changes noted     Objective:   Physical Exam Lower extremity objective findings as follows vascular status appears to be intact pedal pulses DP +2 over 4 PT 1 over 4 bilateral Refill time 3 seconds all digits nails thick brittle Crumley friable discolored incurvated 1 through 5 bilateral patient does have severe HAV deformity rigid digital contractures and spastic contractures of toes lateral deviation of hallux and under lapping of the digits. Nails thick brittle crumbly and criptotic and incurvated. Patient is a wheelchair does stand transfer however does have neurologic abnormalities were for neuropathy patient does have spastic contractures of her feet and hands abnormal motor function of the feet as well as likely sensory abnormalities. Deformities are rigid and relatively fixed. Patient presents this time for nail care however is through her caregiver asking about having surgery corrected straight her toes. Patient was advised however at this time that surgery for this would be extensive and likely high risk with very little chance for improvement as patient's neuropathy and abnormal motor function would likely lead to recurrence of any deformity or lack of stability. When informed that I would likely not do the surgery and I do Think surgery would be beneficial to her overall health condition and neurologic status patient Huston Foley she may be interested in seeing a different doctor. The caregiver presented with her indicates that the patient feels that if I did the surgeryGod would protect and heal her and make her better. I advised the patient at this time that I would not recommend surgery at this time and had little hope for improvement and would likely risk or even losing a toe or foot or leg. Patient would not be able to function  appropriately postop despite any surgical correction.       Assessment & Plan:  Assessment this time is onychomycosis painful thick brittle crumbly dystrophic mycotic nails debrided 1 through 4 bilateral at this time mycotic nails 8 return for future palliative care is needed with its with myself or one of the other doctors in explained to the patient she is not a candidate for surgical intervention for correction of bunions or hammertoes my recommendation would be accommodative therapy wearing a comfortable or loosefitting accommodative shoe extra-depth shoes if needed. Recommended 3 month follow-up for palliative care next.  Regarding the proposed surgery my recommendation is patient is not a good candidate for surgical intervention due to the neuropathy sensory and motor, with spastic contractures of toes and foot. Surgery will not correct that and in fact the deformities would likely recur because of the contracture.  Harriet Masson DPM

## 2014-03-25 NOTE — Patient Instructions (Signed)

## 2014-04-01 ENCOUNTER — Ambulatory Visit: Payer: Medicare Other

## 2014-05-19 DIAGNOSIS — B351 Tinea unguium: Secondary | ICD-10-CM | POA: Diagnosis not present

## 2014-05-19 DIAGNOSIS — E781 Pure hyperglyceridemia: Secondary | ICD-10-CM | POA: Diagnosis not present

## 2014-05-19 DIAGNOSIS — Z0001 Encounter for general adult medical examination with abnormal findings: Secondary | ICD-10-CM | POA: Diagnosis not present

## 2014-05-19 DIAGNOSIS — G809 Cerebral palsy, unspecified: Secondary | ICD-10-CM | POA: Diagnosis not present

## 2014-05-19 DIAGNOSIS — F411 Generalized anxiety disorder: Secondary | ICD-10-CM | POA: Diagnosis not present

## 2014-05-22 ENCOUNTER — Other Ambulatory Visit: Payer: Self-pay

## 2014-05-22 DIAGNOSIS — Z1231 Encounter for screening mammogram for malignant neoplasm of breast: Secondary | ICD-10-CM

## 2014-05-30 ENCOUNTER — Emergency Department (HOSPITAL_COMMUNITY)
Admission: EM | Admit: 2014-05-30 | Discharge: 2014-05-30 | Disposition: A | Discharge status: Home / Self Care | Payer: Medicare Other | Attending: Emergency Medicine | Admitting: Emergency Medicine

## 2014-05-30 ENCOUNTER — Encounter (HOSPITAL_COMMUNITY): Payer: Self-pay

## 2014-05-30 DIAGNOSIS — Z79899 Other long term (current) drug therapy: Secondary | ICD-10-CM | POA: Insufficient documentation

## 2014-05-30 DIAGNOSIS — R45851 Suicidal ideations: Secondary | ICD-10-CM

## 2014-05-30 DIAGNOSIS — Z8709 Personal history of other diseases of the respiratory system: Secondary | ICD-10-CM | POA: Insufficient documentation

## 2014-05-30 DIAGNOSIS — F4321 Adjustment disorder with depressed mood: Secondary | ICD-10-CM

## 2014-05-30 DIAGNOSIS — Z880 Allergy status to penicillin: Secondary | ICD-10-CM | POA: Diagnosis not present

## 2014-05-30 DIAGNOSIS — T1491 Suicide attempt: Secondary | ICD-10-CM | POA: Diagnosis not present

## 2014-05-30 DIAGNOSIS — H9190 Unspecified hearing loss, unspecified ear: Secondary | ICD-10-CM | POA: Insufficient documentation

## 2014-05-30 HISTORY — DX: Anxiety disorder, unspecified: F41.9

## 2014-05-30 HISTORY — DX: Major depressive disorder, single episode, unspecified: F32.9

## 2014-05-30 HISTORY — DX: Cerebral palsy, unspecified: G80.9

## 2014-05-30 HISTORY — DX: Depression, unspecified: F32.A

## 2014-05-30 LAB — COMPREHENSIVE METABOLIC PANEL
ALT: 24 U/L (ref 0–35)
ANION GAP: 10 (ref 5–15)
AST: 26 U/L (ref 0–37)
Albumin: 3.5 g/dL (ref 3.5–5.2)
Alkaline Phosphatase: 82 U/L (ref 39–117)
BILIRUBIN TOTAL: 0.5 mg/dL (ref 0.3–1.2)
CO2: 27 mmol/L (ref 19–32)
CREATININE: 0.43 mg/dL — AB (ref 0.50–1.10)
Calcium: 9.5 mg/dL (ref 8.4–10.5)
Chloride: 105 mmol/L (ref 96–112)
GFR calc Af Amer: >90 mL/min (ref >90)
GFR calc non Af Amer: >90 mL/min (ref >90)
Glucose, Bld: 142 mg/dL — ABNORMAL HIGH (ref 70–99)
POTASSIUM: 3.4 mmol/L — AB (ref 3.5–5.1)
Sodium: 142 mmol/L (ref 135–145)
TOTAL PROTEIN: 6.9 g/dL (ref 6.0–8.3)

## 2014-05-30 LAB — CBC WITH DIFFERENTIAL/PLATELET
BASOS PCT: 1 % (ref 0–1)
Basophils Absolute: 0 10*3/uL (ref 0.0–0.1)
EOS ABS: 0.1 10*3/uL (ref 0.0–0.7)
Eosinophils Relative: 1 % (ref 0–5)
HCT: 37.9 % (ref 36.0–46.0)
HEMOGLOBIN: 12.7 g/dL (ref 12.0–15.0)
Lymphocytes Relative: 36 % (ref 12–46)
Lymphs Abs: 1.6 10*3/uL (ref 0.7–4.0)
MCH: 29 pg (ref 26.0–34.0)
MCHC: 33.5 g/dL (ref 30.0–36.0)
MCV: 86.5 fL (ref 78.0–100.0)
MONOS PCT: 12 % (ref 3–12)
Monocytes Absolute: 0.6 10*3/uL (ref 0.1–1.0)
Neutro Abs: 2.2 10*3/uL (ref 1.7–7.7)
Neutrophils Relative %: 50 % (ref 43–77)
PLATELETS: 343 10*3/uL (ref 150–400)
RBC: 4.38 MIL/uL (ref 3.87–5.11)
RDW: 12.4 % (ref 11.5–15.5)
WBC: 4.5 10*3/uL (ref 4.0–10.5)

## 2014-05-30 LAB — ETHANOL

## 2014-05-30 MED ORDER — ALUM & MAG HYDROXIDE-SIMETH 200-200-20 MG/5ML PO SUSP: 30.0000 mL | ORAL | Status: DC | PRN

## 2014-05-30 MED ORDER — ONDANSETRON HCL 4 MG PO TABS
4.0000 mg | ORAL_TABLET | Freq: Three times a day (TID) | ORAL | Status: DC | PRN
Start: 2014-05-30 — End: 2014-05-30

## 2014-05-30 MED ORDER — IBUPROFEN 200 MG PO TABS: 600.0000 mg | ORAL_TABLET | Freq: Three times a day (TID) | ORAL | Status: DC | PRN

## 2014-05-30 MED ORDER — ACETAMINOPHEN 325 MG PO TABS: 650.0000 mg | ORAL_TABLET | ORAL | Status: DC | PRN

## 2014-05-30 MED ORDER — LORAZEPAM 1 MG PO TABS: 1.0000 mg | ORAL_TABLET | Freq: Three times a day (TID) | ORAL | Status: DC | PRN

## 2014-05-30 NOTE — [facility] Suicide Risk Assessment (Addendum)
Suicide Risk Assessment  Discharge Assessment   [facility] Discharge Suicide Risk Assessment   Demographic Factors:  Caucasian  Total Time spent with patient: 45 minutes  Musculoskeletal: Strength & Muscle Tone: Decreased due to CP Gait & Station: did not walk, wheelchair bound Patient leans: N/A  Psychiatric Specialty Exam:     Blood pressure 175/97, pulse 100, temperature 97.4 F (36.3 C), temperature source Oral, resp. rate 20, SpO2 96 %.There is no weight on file to calculate BMI.  General Appearance: Casual  Eye Contact::  Good  Speech:  Normal Rate  Volume:  Normal  Mood:  Anxious  Affect:  Congruent  Thought Process:  Coherent  Orientation:  Full (Time, Place, and Person)  Thought Content:  WDL  Suicidal Thoughts:  No  Homicidal Thoughts:  No  Memory:  Immediate;   Fair Recent;   Fair Remote;   Fair  Judgement:  Fair  Insight:  Fair  Psychomotor Activity:  Normal  Concentration:  Fair  Recall:  [employer] of Knowledge:Poor  Language: Fair  Akathisia:  No  Handed:  Right  AIMS (if indicated):     Assets:  Catering manager Housing Leisure Time Physical Health Resilience Social Support  ADL's:  Intact  Cognition: Impaired,  Moderate  Sleep:      Has this patient used any form of tobacco in the last 30 days? (Cigarettes, Smokeless Tobacco, Cigars, and/or Pipes) N/A  Mental Status Per Nursing Assessment::   On Admission:   Suicide threat  Current Mental Status by Physician: NA  Loss Factors: NA  Historical Factors: Impulsivity  Risk Reduction Factors:   Sense of responsibility to family, Living with another person, especially a relative, Positive social support and Positive therapeutic relationship  Continued Clinical Symptoms:  Anxiety, mild  Cognitive Features That Contribute To Risk:  None    Suicide Risk:  Minimal: No identifiable suicidal ideation.  Patients presenting with no risk factors but with morbid ruminations; may be  classified as minimal risk based on the severity of the depressive symptoms  Principal Problem: <principal problem not specified> Discharge Diagnoses:  Patient Active Problem List   Diagnosis Date Noted  . Adjustment disorder with depressed mood [F43.21] 05/30/2014    Priority: High  . Suicide threat or attempt [F48.9] 05/30/2014    Priority: High      Plan Of Care/Follow-up recommendations:  Activity:  as tolerated Diet:  heart healthy diet  Is patient on multiple antipsychotic therapies at discharge:  No   Has Patient had three or more failed trials of antipsychotic monotherapy by history:  No  Recommended Plan for Multiple Antipsychotic Therapies: NA    Laine Giovanetti, PMH-NP 05/30/2014, 2:35 PM

## 2014-05-30 NOTE — [facility] Assessment (Signed)
[facility] Assessment Progress Note   Called and scheduled tele assessment with this clinician and gathered clinical information from Carlisle Cater, PA-C at [phone].  Pt to be seen via tele assessment.  Shaune Pascal, MS, [facility] Licensed Professional Counselor Therapeutic Triage Specialist [facility] Phone: [phone] Fax: [phone]
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
## 2014-05-30 NOTE — Discharge Instructions (Signed)
Adjustment Disorder °Most changes in life can cause stress. Getting used to changes may take a few months or longer. If feelings of stress, hopelessness, or worry continue, you may have an adjustment disorder. This stress-related mental health problem may affect your feelings, thinking and how you act. It occurs in both sexes and happens at any age. °SYMPTOMS  °Some of the following problems may be seen and vary from person to person: °· Sadness or depression. °· Loss of enjoyment. °· Thoughts of suicide. °· Fighting. °· Avoiding family and friends. °· Poor school performance. °· Hopelessness, sense of loss. °· Trouble sleeping. °· Vandalism. °· Worry, weight loss or gain. °· Crying spells. °· Anxiety °· Reckless driving. °· Skipping school. °· Poor work performance. °· Nervousness. °· Ignoring bills. °· Poor attitude. °DIAGNOSIS  °Your caregiver will ask what has happened in your life and do a physical exam. They will make a diagnosis of an adjustment disorder when they are sure another problem or medical illness causing your feelings does not exist. °TREATMENT  °When problems caused by stress interfere with you daily life or last longer than a few months, you may need counseling for an adjustment disorder. Early treatment may diminish problems and help you to better cope with the stressful events in your life. Sometimes medication is necessary. Individual counseling and or support groups can be very helpful. °PROGNOSIS  °Adjustment disorders usually last less than 3 to 6 months. The condition may persist if there is long lasting stress. This could include health problems, relationship problems, or job difficulties where you can not easily escape from what is causing the problem. °PREVENTION  °Even the most mentally healthy, highly functioning people can suffer from an adjustment disorder given a significant blow from a life-changing event. There is no way to prevent pain and loss. Most people need help from time  to time. You are not alone. °SEEK MEDICAL CARE IF:  °Your feelings or symptoms listed above do not improve or worsen. °Document Released: 12/07/2005 Document Revised: 06/27/2011 Document Reviewed: 02/28/2007 °Exit[employer]® Patient Information ©2015 [employer]. This information is not intended to replace advice given to you by your health care provider. Make sure you discuss any questions you have with your health care provider. ° °
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
## 2014-06-04 DIAGNOSIS — F411 Generalized anxiety disorder: Secondary | ICD-10-CM | POA: Diagnosis not present

## 2014-06-04 DIAGNOSIS — F70 Mild intellectual disabilities: Secondary | ICD-10-CM | POA: Diagnosis not present

## 2014-06-04 DIAGNOSIS — F348 Other persistent mood [affective] disorders: Secondary | ICD-10-CM | POA: Diagnosis not present

## 2014-06-04 DIAGNOSIS — F918 Other conduct disorders: Secondary | ICD-10-CM | POA: Diagnosis not present

## 2014-06-06 DIAGNOSIS — G809 Cerebral palsy, unspecified: Secondary | ICD-10-CM | POA: Diagnosis not present

## 2014-06-06 DIAGNOSIS — R32 Unspecified urinary incontinence: Secondary | ICD-10-CM | POA: Diagnosis not present

## 2014-06-06 DIAGNOSIS — M79609 Pain in unspecified limb: Secondary | ICD-10-CM | POA: Diagnosis not present

## 2014-06-24 ENCOUNTER — Ambulatory Visit: Payer: Medicare Other

## 2014-06-30 ENCOUNTER — Ambulatory Visit
Admission: RE | Admit: 2014-06-30 | Discharge: 2014-06-30 | Disposition: A | Discharge status: Home / Self Care | Payer: Medicare Other | Source: Ambulatory Visit

## 2014-06-30 DIAGNOSIS — Z1231 Encounter for screening mammogram for malignant neoplasm of breast: Secondary | ICD-10-CM

## 2014-07-09 ENCOUNTER — Ambulatory Visit: Payer: Medicare Other | Admitting: Podiatry

## 2014-07-09 ENCOUNTER — Emergency Department (HOSPITAL_COMMUNITY)
Admission: EM | Admit: 2014-07-09 | Discharge: 2014-07-10 | Disposition: A | Discharge status: Home / Self Care | Payer: Medicare Other | Attending: Emergency Medicine | Admitting: Emergency Medicine

## 2014-07-09 ENCOUNTER — Encounter (HOSPITAL_COMMUNITY): Payer: Self-pay

## 2014-07-09 DIAGNOSIS — Z974 Presence of external hearing-aid: Secondary | ICD-10-CM | POA: Diagnosis not present

## 2014-07-09 DIAGNOSIS — H9190 Unspecified hearing loss, unspecified ear: Secondary | ICD-10-CM | POA: Diagnosis not present

## 2014-07-09 DIAGNOSIS — R45851 Suicidal ideations: Secondary | ICD-10-CM | POA: Diagnosis not present

## 2014-07-09 DIAGNOSIS — Z3202 Encounter for pregnancy test, result negative: Secondary | ICD-10-CM | POA: Insufficient documentation

## 2014-07-09 DIAGNOSIS — Z8669 Personal history of other diseases of the nervous system and sense organs: Secondary | ICD-10-CM | POA: Insufficient documentation

## 2014-07-09 DIAGNOSIS — Z880 Allergy status to penicillin: Secondary | ICD-10-CM | POA: Diagnosis not present

## 2014-07-09 DIAGNOSIS — Z8709 Personal history of other diseases of the respiratory system: Secondary | ICD-10-CM | POA: Diagnosis not present

## 2014-07-09 DIAGNOSIS — F4321 Adjustment disorder with depressed mood: Secondary | ICD-10-CM | POA: Diagnosis present

## 2014-07-09 DIAGNOSIS — F29 Unspecified psychosis not due to a substance or known physiological condition: Secondary | ICD-10-CM | POA: Diagnosis not present

## 2014-07-09 DIAGNOSIS — Z79899 Other long term (current) drug therapy: Secondary | ICD-10-CM | POA: Insufficient documentation

## 2014-07-09 LAB — COMPREHENSIVE METABOLIC PANEL
ALK PHOS: 82 U/L (ref 39–117)
ALT: 28 U/L (ref 0–35)
AST: 27 U/L (ref 0–37)
Albumin: 3.8 g/dL (ref 3.5–5.2)
Anion gap: 9 (ref 5–15)
BUN: 8 mg/dL (ref 6–23)
CO2: 27 mmol/L (ref 19–32)
Calcium: 9.4 mg/dL (ref 8.4–10.5)
Chloride: 103 mmol/L (ref 96–112)
Creatinine, Ser: 0.42 mg/dL — ABNORMAL LOW (ref 0.50–1.10)
GFR calc Af Amer: >90 mL/min (ref >90)
GLUCOSE: 102 mg/dL — AB (ref 70–99)
POTASSIUM: 3.6 mmol/L (ref 3.5–5.1)
SODIUM: 139 mmol/L (ref 135–145)
TOTAL PROTEIN: 7.2 g/dL (ref 6.0–8.3)
Total Bilirubin: 0.6 mg/dL (ref 0.3–1.2)

## 2014-07-09 LAB — SALICYLATE LEVEL

## 2014-07-09 LAB — ACETAMINOPHEN LEVEL

## 2014-07-09 LAB — RAPID URINE DRUG SCREEN, HOSP PERFORMED
Amphetamines: NOT DETECTED
Barbiturates: NOT DETECTED
Benzodiazepines: NOT DETECTED
Cocaine: NOT DETECTED
Opiates: NOT DETECTED
Tetrahydrocannabinol: NOT DETECTED

## 2014-07-09 LAB — CBC WITH DIFFERENTIAL/PLATELET
BASOS PCT: 1 % (ref 0–1)
Basophils Absolute: 0 10*3/uL (ref 0.0–0.1)
EOS PCT: 2 % (ref 0–5)
Eosinophils Absolute: 0.1 10*3/uL (ref 0.0–0.7)
HEMATOCRIT: 38.7 % (ref 36.0–46.0)
Hemoglobin: 13.2 g/dL (ref 12.0–15.0)
Lymphocytes Relative: 34 % (ref 12–46)
Lymphs Abs: 1.9 10*3/uL (ref 0.7–4.0)
MCH: 29.7 pg (ref 26.0–34.0)
MCHC: 34.1 g/dL (ref 30.0–36.0)
MCV: 87.2 fL (ref 78.0–100.0)
MONO ABS: 0.6 10*3/uL (ref 0.1–1.0)
MONOS PCT: 11 % (ref 3–12)
Neutro Abs: 2.8 10*3/uL (ref 1.7–7.7)
Neutrophils Relative %: 52 % (ref 43–77)
Platelets: 311 10*3/uL (ref 150–400)
RBC: 4.44 MIL/uL (ref 3.87–5.11)
RDW: 12.4 % (ref 11.5–15.5)
WBC: 5.4 10*3/uL (ref 4.0–10.5)

## 2014-07-09 LAB — POC URINE PREG, ED: Preg Test, Ur: NEGATIVE

## 2014-07-09 LAB — ETHANOL: Alcohol, Ethyl (B): <5 mg/dL (ref 0–9)

## 2014-07-09 NOTE — ED Notes (Signed)
Sitter at bedside.

## 2014-07-09 NOTE — ED Notes (Signed)
Per pt, she normally gets a coke for breakfast lunch and dinner.  Normally gets snack at night.  Pt states the caregiver called MD without notifying patient.  Told MD that pt was having incontinence.  MD told caregiver to limit cokes so patient was not given one this morning. Pt also had snack taken away last night and told that her cable may get unplugged.  Pt states she has money to buy her own cokes and only gets the 3 per day.  She also states Kamundi used to be happy but is not happy anymore.  That she has had her meds changed which is making her too weak to make it to the bathroom causing accidents.  Larene Pickett has had to help her to get to bed.
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
## 2014-07-09 NOTE — ED Notes (Signed)
Bed: WA03 Expected date:  Expected time:  Means of arrival:  Comments: Cerebral palsy, suicidal

## 2014-07-09 NOTE — ED Notes (Signed)
Per EMS, pt from [facility].  Pt has cerebral palsy.  Pt is also extremely hard of hearing and read lips.  Wheelchair bound.  Pt is saying that the roommate called pt doctor and told him to take away her coke.  Pt is now being given tea/water.  Pt is angry and told staff she was going to kill herself.  Pt had attempt a month ago.  Pt put a rope around her neck.  Pt speech is difficult to understand.  She is her own guardian.  Vitals :hr 110, bp 130/78, resp 16,
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
## 2014-07-09 NOTE — ED Provider Notes (Signed)
CSN: [id number]     Arrival date & time 07/09/14  1149 History   First MD Initiated Contact with Patient 07/09/14 1156     Chief Complaint  Patient presents with  . Suicidal     (Consider location/radiation/quality/duration/timing/severity/associated sxs/prior Treatment) The history is provided by the patient and medical records.    This is a 54 year old female with past medical history significant for hearing loss, anxiety, depression, cerebral palsy, presenting to the ED for suicidal ideation.  Patient lives with caregiver, Ethel Rana, and states she is overall fairly happy with her living arrangements, however over the past 2 days she has been sad because caregiver has taken away her coca-colas and night time snacks.  She states that generally she is allotted 3 Coca-Cola sodas per day, one with each meal.  States caregiver called her PCP yesterday and discussed her recent urinary accidents at night as PCP told her to reduce coca-colas.  Patient states her doctor has recently changed her medications which is making her somewhat sluggish at night time which is why she thinks she is having trouble getting to the bathroom.  Caregiver is now having to help her more than usual because of this.  Patient also states caregiver had appeared "sad" because she never smiles and will not talk to her.  States she has also unplugged the TV and not allowed her to watch cable.  Patient states she does not understand why her things are being taken away.  States she got very upset this morning and voiced that she was going to hurt herself.  She states she did try to hang herself last month.  Past Medical History  Diagnosis Date  . Sinus disorder   . Hearing loss   . Anxiety   . Depression   . Cerebral palsy    Past Surgical History  Procedure Laterality Date  . Brain surgery     Family History  Problem Relation Age of Onset  . Family history unknown: Yes   History  Substance Use Topics  . Smoking status:  Never Smoker   . Smokeless tobacco: Never Used  . Alcohol Use: 0.6 oz/week    1 Shots of liquor per week     Comment: occasionally   OB History    No data available     Review of Systems  Psychiatric/Behavioral: Positive for suicidal ideas.  All other systems reviewed and are negative.     Allergies  Penicillins  Home Medications   Prior to Admission medications   Medication Sig Start Date End Date Taking? Authorizing Provider  Calcium Carb-Cholecalciferol (CALCIUM 600 + D PO) Take by mouth 2 (two) times daily.   Yes Historical Provider, MD  clonazePAM (KLONOPIN) 0.5 MG tablet Take 0.5 mg by mouth 2 (two) times daily as needed for anxiety.   Yes Historical Provider, MD  Multiple Vitamin (MULTIVITAMIN WITH MINERALS) TABS tablet Take 1 tablet by mouth daily.   Yes Historical Provider, MD  PARoxetine (PAXIL) 10 MG tablet Take 20 mg by mouth daily.    Yes Historical Provider, MD  diphenhydrAMINE (SOMINEX) 25 MG tablet Take 50 mg by mouth at bedtime.     Historical Provider, MD  ibuprofen (ADVIL,MOTRIN) 600 MG tablet Take 600 mg by mouth every 6 (six) hours as needed for headache or moderate pain.     Historical Provider, MD  Omega-3 Fatty Acids (FISH OIL PO) Take 1 tablet by mouth at bedtime.     Historical Provider, MD  ondansetron [facility]) 4  MG tablet Take 1 tablet (4 mg total) by mouth every 6 (six) hours. Patient not taking: Reported on 05/30/2014 10/25/13   Larene Pickett, PA-C  venlafaxine [facility]) 75 MG tablet Take 75 mg by mouth daily.    Historical Provider, MD   BP 132/79 mmHg  Pulse 104  Temp(Src) 97.3 F (36.3 C) (Oral)  Resp 20  SpO2 97%   Physical Exam  Constitutional: She is oriented to person, place, and time. She appears well-developed and well-nourished. No distress.  HENT:  Head: Normocephalic and atraumatic.  Mouth/Throat: Oropharynx is clear and moist.  Hearing aid right ear  Eyes: Conjunctivae and EOM are normal. Pupils are equal, round, and reactive  to light.  Neck: Normal range of motion.  Cardiovascular: Normal rate, regular rhythm and normal heart sounds.   Pulmonary/Chest: Effort normal and breath sounds normal.  Abdominal: Soft. Bowel sounds are normal.  Musculoskeletal: Normal range of motion. She exhibits no edema.  Neurological: She is alert and oriented to person, place, and time.  Skin: Skin is warm and dry.  Psychiatric: She has a normal mood and affect. She is not actively hallucinating. She expresses suicidal ideation. She expresses no homicidal ideation. She expresses no suicidal plans and no homicidal plans.  Speech slow but appropriate  Nursing note and vitals reviewed.   ED Course  Procedures (including critical care time) Labs Review Labs Reviewed  COMPREHENSIVE METABOLIC PANEL - Abnormal; Notable for the following:    Glucose, Bld 102 (*)    Creatinine, Ser 0.42 (*)    All other components within normal limits  ACETAMINOPHEN LEVEL - Abnormal; Notable for the following:    Acetaminophen (Tylenol), Serum <10.0 (*)    All other components within normal limits  CBC WITH DIFFERENTIAL/PLATELET  ETHANOL  URINE RAPID DRUG SCREEN (HOSP PERFORMED)  SALICYLATE LEVEL  POC URINE PREG, ED    Imaging Review No results found.   EKG Interpretation None      MDM   Final diagnoses:  Suicidal ideation   54 year old female with suicidal ideation. She has a history of suicide attempt in the past by tying rope around her neck.  Patient denies any homicidal ideation. No auditory or visual hallucinations. Screening lab work was obtained which is reassuring. Patient medically cleared and awaiting TTS evaluation.  Patient given lunch tray and drink, no further needs at this time.  3:46 PM TTS evaluation pending.  Care signed out to [place].  Disposition per TTS.  Larene Pickett, PA-C 07/09/14 [place], MD 07/25/14 [phone]

## 2014-07-10 DIAGNOSIS — F4321 Adjustment disorder with depressed mood: Secondary | ICD-10-CM | POA: Diagnosis not present

## 2014-07-10 DIAGNOSIS — R45851 Suicidal ideations: Secondary | ICD-10-CM | POA: Diagnosis not present

## 2014-07-10 MED ORDER — VENLAFAXINE HCL 75 MG PO TABS: 75.0000 mg | ORAL_TABLET | Freq: Every day | ORAL | Status: DC

## 2014-07-10 MED ORDER — DIPHENHYDRAMINE HCL 25 MG PO CAPS: 50.0000 mg | ORAL_CAPSULE | Freq: Every day | ORAL | Status: DC

## 2014-07-10 MED ORDER — VENLAFAXINE HCL ER 75 MG PO CP24
75.0000 mg | ORAL_CAPSULE | Freq: Every day | ORAL | Status: DC
Start: 2014-07-11 — End: 2014-07-10

## 2014-07-10 MED ORDER — IBUPROFEN 200 MG PO TABS: 600.0000 mg | ORAL_TABLET | Freq: Four times a day (QID) | ORAL | Status: DC | PRN

## 2014-07-10 MED ORDER — DIPHENHYDRAMINE HCL (SLEEP) 25 MG PO TABS: 50.0000 mg | ORAL_TABLET | Freq: Every day | ORAL | Status: DC

## 2014-07-10 MED ORDER — PAROXETINE HCL 20 MG PO TABS
20.0000 mg | ORAL_TABLET | Freq: Every day | ORAL | Status: DC
  Administered 2014-07-10: 20 mg via ORAL
  Filled 2014-07-10 (×2): qty 1

## 2014-07-10 NOTE — ED Notes (Signed)
Pt ate 50% of her breakfast at this time
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
## 2014-07-10 NOTE — Progress Notes (Signed)
CSW consulted with nurse who states that pt has been successfully discharged.   Willette Brace [phone] ED CSW 07/10/2014 4:12 PM

## 2014-07-10 NOTE — Plan of Care (Signed)
Per patient advocate, Tamara Curry, patient is to have limited soda due to patient obsession. This can be a trigger for patient in the group home. Please limit sodas to no more than 1 per hour.   Belia Heman, [place] Work  [employer] [phone]

## 2014-07-10 NOTE — Progress Notes (Signed)
Per psychiatrist, patient is psychiatrically stable for discharge back to Group home. CSW confirmed plans with [facility] provider, Winfield Rast [phone]. Pt [facility] is picking up patient at approximately 3pm. CSW contacted patient advocate Lynann Bologna [phone] who agreed with plan. Patient plans to follow up with Lexine Baton of care for initial intake on 3/30. No further Clinical Social Work needs, signing off.     Belia Heman, [place] Work  [employer] [phone]

## 2014-07-10 NOTE — ED Notes (Signed)
Pt ate 100% of her lunch at this time

## 2014-07-10 NOTE — ED Notes (Signed)
Pt refused bath at this time

## 2014-07-16 ENCOUNTER — Encounter (HOSPITAL_COMMUNITY): Payer: Medicare Other | Admitting: Psychiatry

## 2014-08-10 NOTE — Progress Notes (Deleted)
[facility] MD Progress Note  08/10/2014 6:52 PM Tamara Curry  MRN:  [id number] Subjective:  *** Principal Problem: @PPROB @ Diagnosis:   Patient Active Problem List   Diagnosis Date Noted  . Adjustment disorder with depressed mood [F43.21] 05/30/2014  . Suicide threat or attempt [F48.9] 05/30/2014   Total Time spent with patient: {Time; 15 min - 8 hours:17441}   Past Medical History:  Past Medical History  Diagnosis Date  . Sinus disorder   . Hearing loss   . Anxiety   . Depression   . Cerebral palsy     Past Surgical History  Procedure Laterality Date  . Brain surgery     Family History:  Family History  Problem Relation Age of Onset  . Family history unknown: Yes   Social History:  History  Alcohol Use  . 0.6 oz/week  . 1 Shots of liquor per week    Comment: occasionally     History  Drug Use No    History   Social History  . Marital Status: Single    Spouse Name: N/A  . Number of Children: N/A  . Years of Education: N/A   Social History Main Topics  . Smoking status: Never Smoker   . Smokeless tobacco: Never Used  . Alcohol Use: 0.6 oz/week    1 Shots of liquor per week     Comment: occasionally  . Drug Use: No  . Sexual Activity: Not on file   Other Topics Concern  . Not on file   Social History Narrative   Additional History:    Sleep: {BHH GOOD/FAIR/POOR:22877}  Appetite:  {BHH GOOD/FAIR/POOR:22877}   Assessment:   Musculoskeletal: Strength & Muscle Tone: {desc; muscle tone:32375} Gait & Station: {PE GAIT ED PPJK:93267} Patient leans: {Patient Leans:21022755}   Psychiatric Specialty Exam: Physical Exam  ROS  Blood pressure 111/79, pulse 76, height 4\' 6"  (1.372 m), weight 120 lb (54.432 kg).Body mass index is 28.92 kg/(m^2).  General Appearance: {Appearance:22683}  Eye Contact::  {BHH EYE CONTACT:22684}  Speech:  {Speech:22685}  Volume:  {Volume (PAA):22686}  Mood:  {BHH MOOD:22306}  Affect:  {Affect (PAA):22687}  Thought Process:   {Thought Process (PAA):22688}  Orientation:  {BHH ORIENTATION (PAA):22689}  Thought Content:  {Thought Content:22690}  Suicidal Thoughts:  {ST/HT (PAA):22692}  Homicidal Thoughts:  {ST/HT (PAA):22692}  Memory:  {BHH MEMORY:22881}  Judgement:  {Judgement (PAA):22694}  Insight:  {Insight (PAA):22695}  Psychomotor Activity:  {Psychomotor (PAA):22696}  Concentration:  {BHH GOOD/FAIR/POOR:22877}  Recall:  {BHH GOOD/FAIR/POOR:22877}  Fund of Knowledge:{BHH GOOD/FAIR/POOR:22877}  Language: {BHH GOOD/FAIR/POOR:22877}  Akathisia:  {BHH YES OR NO:22294}  Handed:  {Handed:22697}  AIMS (if indicated):     Assets:  {Assets (PAA):22698}  ADL's:  {BHH TIW'P:80998}  Cognition: {chl bhh cognition:304700322}  Sleep:        Current Medications: Current Outpatient Prescriptions  Medication Sig Dispense Refill  . Calcium Carb-Cholecalciferol (CALCIUM 600 + D PO) Take by mouth 2 (two) times daily.    . clonazePAM (KLONOPIN) 0.5 MG tablet Take 0.5 mg by mouth 2 (two) times daily as needed for anxiety.    . diphenhydrAMINE (SOMINEX) 25 MG tablet Take 50 mg by mouth at bedtime.     Marland Kitchen ibuprofen (ADVIL,MOTRIN) 600 MG tablet Take 600 mg by mouth every 6 (six) hours as needed for headache or moderate pain.     . Multiple Vitamin (MULTIVITAMIN WITH MINERALS) TABS tablet Take 1 tablet by mouth daily.    . Omega-3 Fatty Acids (FISH OIL PO) Take 1 tablet by  mouth at bedtime.     Marland Kitchen PARoxetine (PAXIL) 10 MG tablet Take 20 mg by mouth daily.     Marland Kitchen venlafaxine XR (EFFEXOR-XR) 75 MG 24 hr capsule Take 75 mg by mouth daily with breakfast.     No current facility-administered medications for this visit.    Lab Results: No results found for this or any previous visit (from the past 48 hour(s)).  Physical Findings: AIMS:  , ,  ,  ,    CIWA:    COWS:     Treatment Plan Summary: {CHL [facility] MD TX. VQMG:867619509}   Medical Decision Making:  {bh medical decision making:21022756}     Tamara Curry 08/10/2014, 6:52 PM  This encounter was created in error - please disregard. This encounter was created in error - please disregard.

## 2014-08-22 ENCOUNTER — Ambulatory Visit: Payer: Medicare Other | Admitting: Podiatry

## 2014-08-22 ENCOUNTER — Ambulatory Visit: Payer: Medicare Other | Admitting: Psychology

## 2014-09-01 ENCOUNTER — Ambulatory Visit: Payer: Medicare Other | Admitting: Podiatry

## 2014-09-10 DIAGNOSIS — F918 Other conduct disorders: Secondary | ICD-10-CM | POA: Diagnosis not present

## 2014-09-10 DIAGNOSIS — F410 Panic disorder [episodic paroxysmal anxiety] without agoraphobia: Secondary | ICD-10-CM | POA: Diagnosis not present

## 2014-09-10 DIAGNOSIS — K700 Alcoholic fatty liver: Secondary | ICD-10-CM | POA: Diagnosis not present

## 2014-09-10 DIAGNOSIS — F348 Other persistent mood [affective] disorders: Secondary | ICD-10-CM | POA: Diagnosis not present

## 2014-09-17 ENCOUNTER — Ambulatory Visit (INDEPENDENT_AMBULATORY_CARE_PROVIDER_SITE_OTHER): Payer: Medicare Other | Admitting: Podiatry

## 2014-09-17 ENCOUNTER — Encounter: Payer: Self-pay | Admitting: Podiatry

## 2014-09-17 DIAGNOSIS — M79676 Pain in unspecified toe(s): Secondary | ICD-10-CM

## 2014-09-17 DIAGNOSIS — B351 Tinea unguium: Secondary | ICD-10-CM | POA: Diagnosis not present

## 2014-09-17 DIAGNOSIS — G629 Polyneuropathy, unspecified: Secondary | ICD-10-CM

## 2014-09-17 MED ORDER — CLOTRIMAZOLE-BETAMETHASONE 1-0.05 % EX CREA: 1.0000 "application " | TOPICAL_CREAM | Freq: Two times a day (BID) | CUTANEOUS | Status: DC

## 2014-09-18 NOTE — Progress Notes (Signed)
Patient ID: Tamara Curry, female   DOB: 04-03-1961, 54 y.o.   MRN: [id number]  Subjective: 54 y.o.-year-old female returns the office today for painful, elongated, thickened toenails which she is unable to trim herself. She presents today with a caregiver. Denies any redness or drainage around the nails. Denies any acute changes since last appointment and no new complaints today. Denies any systemic complaints such as fevers, chills, nausea, vomiting.   Objective: AAO 3, NAD DP/PT pulses palpable, CRT less than 3 seconds Protective sensation decreased with Simms Weinstein monofilament Nails hypertrophic, dystrophic, elongated, brittle, discolored 10. There is tenderness overlying the nails 1-5 bilaterally. There is no surrounding erythema or drainage along the nail sites. No open lesions or pre-ulcerative lesions are identified. There is significant HAV deformity bilaterally with hammertoe contractures. The 2nd digit is overriding the hallux.  No other areas of tenderness bilateral lower extremities. No overlying edema, erythema, increased warmth. No pain with calf compression, swelling, warmth, erythema.  Assessment: Patient presents with symptomatic onychomycosis  Plan: -Treatment options including alternatives, risks, complications were discussed -Nails sharply debrided 10 without complication/bleeding. -Discussed daily foot inspection. If there are any changes, to call the office immediately.  -Follow-up in 3 months or sooner if any problems are to arise. In the meantime, encouraged to call the office with any questions, concerns, changes symptoms.

## 2014-09-26 ENCOUNTER — Telehealth: Payer: Self-pay | Admitting: *Deleted

## 2014-09-26 NOTE — Telephone Encounter (Addendum)
Ms. Ronnald Ramp states pt has a red, swollen area to the left foot with a fluid filled lump, they would like an appt Wednesday or Friday of next week.  Ms. Ronnald Ramp states the pt does not complain of any pain in the area, but would like to know what to do for the area until seen.  I left message with Ms. Ronnald Ramp voicemail with Dr. Leigh Aurora orders to go to the ER.

## 2014-09-26 NOTE — Telephone Encounter (Signed)
They need to come in or go to urgent care/ED

## 2014-09-29 ENCOUNTER — Telehealth: Payer: Self-pay | Admitting: *Deleted

## 2014-09-29 NOTE — Telephone Encounter (Signed)
Ms Ronnald Ramp states pt has a lump on the side of her foot, that looks like gout, but is decreasing in size, but she is wanting to know what to do for it.  Ms. Ronnald Ramp states pt refused to go to the ER.

## 2014-10-01 ENCOUNTER — Ambulatory Visit: Payer: Medicare Other | Admitting: Podiatry

## 2014-10-03 ENCOUNTER — Ambulatory Visit (INDEPENDENT_AMBULATORY_CARE_PROVIDER_SITE_OTHER): Payer: Medicare Other | Admitting: Podiatry

## 2014-10-03 ENCOUNTER — Ambulatory Visit: Payer: Medicare Other

## 2014-10-03 ENCOUNTER — Other Ambulatory Visit: Payer: Self-pay | Admitting: Podiatry

## 2014-10-03 ENCOUNTER — Telehealth: Payer: Self-pay | Admitting: *Deleted

## 2014-10-03 ENCOUNTER — Encounter: Payer: Self-pay | Admitting: Podiatry

## 2014-10-03 VITALS — BP 126/66 | HR 89 | Resp 15

## 2014-10-03 DIAGNOSIS — R52 Pain, unspecified: Secondary | ICD-10-CM

## 2014-10-03 DIAGNOSIS — M779 Enthesopathy, unspecified: Secondary | ICD-10-CM | POA: Diagnosis not present

## 2014-10-03 DIAGNOSIS — M2540 Effusion, unspecified joint: Secondary | ICD-10-CM | POA: Diagnosis not present

## 2014-10-03 NOTE — Telephone Encounter (Signed)
Called [facility] lab for information on ordering labs for the aspiration of right 1st MPJ fluid was instructed to use 65530.  I checked our [facility] forms and found 6585 Synovial Fluid panel, and [id number] Crystals Synovial fluid

## 2014-10-04 LAB — SYNOVIAL FLUID PANEL
Crystals, Fluid: NONE SEEN
EOSINOPHILS-SYNOVIAL: 0 % (ref 0–1)
Glucose, Synovial Fluid: <20 mg/dL
Lymphocytes-Synovial Fld: 7 % (ref 0–20)
MONOCYTE/MACROPHAGE: 9 % — AB (ref 50–90)
Neutrophil, Synovial: 84 % — ABNORMAL HIGH (ref 0–25)
PROTEIN, SYNOVIAL FLUID: 3.9 g/dL — AB (ref 1.0–3.0)

## 2014-10-04 LAB — SYNOVIAL FLUID, CRYSTAL: Crystals, Fluid: NONE SEEN

## 2014-10-08 ENCOUNTER — Telehealth: Payer: Self-pay

## 2014-10-08 LAB — BODY FLUID CULTURE
Gram Stain: NONE SEEN
Gram Stain: NONE SEEN
ORGANISM ID, BACTERIA: NO GROWTH

## 2014-10-08 MED ORDER — DOXYCYCLINE HYCLATE 100 MG PO TABS: 100.0000 mg | ORAL_TABLET | Freq: Two times a day (BID) | ORAL | Status: DC

## 2014-10-08 NOTE — Telephone Encounter (Signed)
Spoke with pts caregiver regarding lab results from synovial fluid aspiration. Results showed a possible infectious and inflammatory condition, so I advised caregiver that antibiotics were called into pharmacy and she should beging to take them as soon as possible and keep her appt for this friday

## 2014-10-10 ENCOUNTER — Ambulatory Visit (INDEPENDENT_AMBULATORY_CARE_PROVIDER_SITE_OTHER): Payer: Medicare Other | Admitting: Podiatry

## 2014-10-10 VITALS — BP 120/60 | HR 88 | Resp 16

## 2014-10-10 DIAGNOSIS — M779 Enthesopathy, unspecified: Secondary | ICD-10-CM

## 2014-10-10 DIAGNOSIS — M25474 Effusion, right foot: Secondary | ICD-10-CM

## 2014-10-10 NOTE — Patient Instructions (Signed)
Finish course of antibiotics If the redness worsens call the office immediately. Monitor for any signs/symptoms of infection. Call the office immediately if any occur or go directly to the emergency room. Call with any questions/concerns.

## 2014-10-11 NOTE — Progress Notes (Signed)
Patient ID: Tamara Curry, female   DOB: 05/18/1960, 54 y.o.   MRN: [id number]  Subjective: 54 year old female presents to the office today for follow-up evaluation of right 1st MTPJ pain, swelling, joint effusion. She states that since last appointment she is doing well and she believes the area has been getting better. She has started the antibiotics that were called in for her. She states the pain has improved as well. She denies any history of injury or trauma to the area. No other complaints at this time.  Objective: Awake, alert, NAD DP/PT pulses palpable, CRT less than 3 seconds Protective sensation decreased with Simms Weinstein monofilament Right first MTPJ continues recently edematous, erythematous however it does appear to be improving compared to last appointment. There is no areas of fluctuance and there is no drainable collection off the area at this time. There is no ascending cellulitis, fluctuance, crepitus. There is no open lesions identified and there is no malodor. There is mild discomfort with first MTPJ range of motion. 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 No other areas of tenderness to bilateral lower extremes. No other areas of edema, erythema, increase in warmth. No pain with calf compression, swelling, warmth, erythema. No open lesions or pre-ulcer lesions identified bilaterally.  Assessment: 54 year old female follow-up evaluation right first MTPJ pain/joint effusion likely inflammation however cannot rule out infectious  Plan: -Treatment options discussed including all alternatives, risks, and complications -Joint analysis was review with the patient. There is no evidence of gout. Culture was negative for bacteria although neutrophils were increased. Finish course of antibiotic. -Would immobilize and a surgical shoe however she states that she does not walk and she is in a wheelchair. -Continue to monitor for any clinical signs or  symptoms and infection or any worsening directed to call the office should any occur or go to the ER. -Follow-up in 2-3 weeks to recheck the symptoms or sooner if any problems are to arise. In the meantime I encouraged him to call the office with any question, concerns, changes symptoms.  Celesta Gentile, DPM

## 2014-10-11 NOTE — Progress Notes (Signed)
Patient ID: Tamara Curry, female   DOB: Nov 22, 1960, 54 y.o.   MRN: [id number]  Subjective: 54 year old female presents the office they with a caregiver for concerns of right first MTPJ swelling, redness, pain.  She describes as a throbbing pain to the big toe joint. She denies any history of injury or trauma to the area. She denies a history of gout. She has not changed her diet recently. She  Does not walk and is in a wheelchair mostly. She states this been ongoing since June 2 and has remained about the same. No treatment so far. No other complaints at this time. Denies any systemic complaints as fevers, chills, nausea, vomiting.  Objective: Awake, alert, NAD DP/PT pulses palpable b/l, CRT < 3 sec Protective sensation decreased with Simms Weinstein monofilament Along the right first MTPJ there is edema, erythema over the dorsal aspect of the joint without any ascending cellulitis. There is fluctuance overlying the dorsal aspect of the joint however there is no crepitation. There is no open lesion identified. There is tenderness to palpation directly overlying this area. The area of edema and erythema appear to be localized over the 1st MTPJ. There are no other areas of edema, erythema, increase in warmth to other areas of bilateral lower extremities.  No open lesions or pre-ulcerative lesions.  No pain with calf compression, swelling, warmth, erythema.   Assessment: 54 year old female with right first MTPJ edema and erythema possible gout versus inflammation.   Plan: -X-rays were obtained and reviewed with the patient.  -Treatment options discussed including all alternatives, risks, and complications -Given the fluctuance along the dorsal aspect of the joint I recommended aspiration of the area. Discussed risks, complications with patient/caregiver wish they understand and wish to proceed. Under sterile conditions a total of 2 mL of a one-to-one mixture of 2% lidocaine plain and 0.5% Marcaine  plain was infiltrated in a regional block fashion around the dorsal aspect of the first MTPJ. Once anesthetized, the skin was then prepped in sterile fashion. An 18-gauge needle was utilized to aspirate the fluid off the joint. A total of 4 mL of fluid was aspirated. The fluid was aspirated to be her to be yellow in color and there is no purulence and it did not appear to be any gouty tophi expressed either. The fluid was sent for culture and for joint analysis. A Band-Aid was placed over the puncture site followed by a compression bandage. Post procedure care was discussed with the patient/caregiver. She tolerated the procedure well any complications. - We'll hold off on starting medication to look joint culture and aspiration are obtained. It does not appear to be infectious at this time. However discussed with her that if there is any signs or symptoms of infection to call the office immediately or go directly to the emergency room. -Follow-up 1 week or sooner if any problems arise. In the meantime, encouraged to call the office with any questions, concerns, change in symptoms.    Once I received the results of the joint analysis, the culture was negative however there was an increase in neutrophils. There were no crystals identified. I called in doxycycline and the patient was made aware.   Celesta Gentile, DPM

## 2014-10-31 ENCOUNTER — Ambulatory Visit (INDEPENDENT_AMBULATORY_CARE_PROVIDER_SITE_OTHER): Payer: Medicare Other | Admitting: Podiatry

## 2014-10-31 ENCOUNTER — Encounter: Payer: Self-pay | Admitting: Podiatry

## 2014-10-31 VITALS — BP 113/70 | HR 92 | Resp 12

## 2014-10-31 DIAGNOSIS — R609 Edema, unspecified: Secondary | ICD-10-CM | POA: Diagnosis not present

## 2014-10-31 DIAGNOSIS — M2540 Effusion, unspecified joint: Secondary | ICD-10-CM

## 2014-11-01 LAB — CBC WITH DIFFERENTIAL/PLATELET
Basophils Absolute: 0.1 10*3/uL (ref 0.0–0.1)
Basophils Relative: 1 % (ref 0–1)
Eosinophils Absolute: 0.1 10*3/uL (ref 0.0–0.7)
Eosinophils Relative: 2 % (ref 0–5)
HCT: 37.8 % (ref 36.0–46.0)
Hemoglobin: 12.8 g/dL (ref 12.0–15.0)
LYMPHS PCT: 35 % (ref 12–46)
Lymphs Abs: 1.9 10*3/uL (ref 0.7–4.0)
MCH: 29 pg (ref 26.0–34.0)
MCHC: 33.9 g/dL (ref 30.0–36.0)
MCV: 85.7 fL (ref 78.0–100.0)
MPV: 9.4 fL (ref 8.6–12.4)
Monocytes Absolute: 0.7 10*3/uL (ref 0.1–1.0)
Monocytes Relative: 13 % — ABNORMAL HIGH (ref 3–12)
NEUTROS ABS: 2.7 10*3/uL (ref 1.7–7.7)
Neutrophils Relative %: 49 % (ref 43–77)
PLATELETS: 384 10*3/uL (ref 150–400)
RBC: 4.41 MIL/uL (ref 3.87–5.11)
RDW: 12.9 % (ref 11.5–15.5)
WBC: 5.5 10*3/uL (ref 4.0–10.5)

## 2014-11-01 LAB — C-REACTIVE PROTEIN: CRP: <0.5 mg/dL (ref <0.60)

## 2014-11-01 LAB — RHEUMATOID FACTOR: Rhuematoid fact SerPl-aCnc: <10 IU/mL (ref <=14)

## 2014-11-01 LAB — SEDIMENTATION RATE: Sed Rate: 12 mm/hr (ref 0–30)

## 2014-11-01 LAB — URIC ACID: Uric Acid, Serum: 5.4 mg/dL (ref 2.4–7.0)

## 2014-11-03 LAB — ANA: Anti Nuclear Antibody(ANA): NEGATIVE

## 2014-11-03 NOTE — Progress Notes (Signed)
Patient ID: Tamara Curry, female   DOB: 1960-09-11, 54 y.o.   MRN: [id number]  Subjective: Tamara Curry presents to the office today for follow-up evaluation of right 1st MTPJ pain, swelling, joint effusion. She says overall she is feeling better although she does continue to have some swelling and pain to the big toe joint.  She states the pain has improved as well. Denies any systemic complaints as fevers, chills, nausea, vomiting. No other complaints at this time.  Objective: Awake, alert, NAD DP/PT pulses palpable, CRT less than 3 seconds Protective sensation decreased with Simms Weinstein monofilament Right first MTPJ continues to be somewhat edematous and erythematous however it does appear to be improving compared to last appointment. There are no areas of fluctuance or crepitance and there is no drainable collection of the area at this time. There is no ascending cellulitis.. There is no open lesions identified and there is no malodor. There is mild discomfort with first MTPJ range of motion. 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 No other areas of tenderness to bilateral lower extremes. No other areas of edema, erythema, increase in warmth. No pain with calf compression, swelling, warmth, erythema. No open lesions or pre-ulcer lesions identified bilaterally.  Assessment: 54 year old female follow-up evaluation right first MTPJ pain/joint effusion likely inflammation however cannot rule out infectious; improving   Plan: -Treatment options discussed including all alternatives, risks, and complications -Will order lab work including CBC, ESR, CRP, Uric acid  -Ibuprofen  -Continue to monitor for any clinical signs or symptoms and infection or any worsening directed to call the office should any occur or go to the ER. -Follow-up in 3 weeks to recheck the symptoms or sooner if any problems are to arise. In the meantime I encouraged him to call the office with any  question, concerns, changes symptoms.  Celesta Gentile, DPM

## 2014-11-04 ENCOUNTER — Telehealth: Payer: Self-pay | Admitting: *Deleted

## 2014-11-04 NOTE — Telephone Encounter (Addendum)
Dr. Jacqualyn Posey reviewed pt's 10/31/2014 bloodwork as normal.  I left a message for pt's caregiver to call for results, [phone].  I informed Ms. Marylu Lund of pt's results.

## 2014-11-21 ENCOUNTER — Ambulatory Visit (INDEPENDENT_AMBULATORY_CARE_PROVIDER_SITE_OTHER): Payer: Medicare Other | Admitting: Podiatry

## 2014-11-21 ENCOUNTER — Encounter: Payer: Self-pay | Admitting: Podiatry

## 2014-11-21 DIAGNOSIS — M2540 Effusion, unspecified joint: Secondary | ICD-10-CM

## 2014-11-21 DIAGNOSIS — M779 Enthesopathy, unspecified: Secondary | ICD-10-CM

## 2014-11-23 ENCOUNTER — Encounter (HOSPITAL_COMMUNITY): Payer: Self-pay | Admitting: Emergency Medicine

## 2014-11-23 ENCOUNTER — Emergency Department (HOSPITAL_COMMUNITY)
Admission: EM | Admit: 2014-11-23 | Discharge: 2014-11-23 | Disposition: A | Discharge status: Home / Self Care | Payer: Medicare Other | Attending: Emergency Medicine | Admitting: Emergency Medicine

## 2014-11-23 DIAGNOSIS — R000 Tachycardia, unspecified: Secondary | ICD-10-CM | POA: Diagnosis not present

## 2014-11-23 DIAGNOSIS — R197 Diarrhea, unspecified: Secondary | ICD-10-CM | POA: Insufficient documentation

## 2014-11-23 DIAGNOSIS — R112 Nausea with vomiting, unspecified: Secondary | ICD-10-CM | POA: Diagnosis not present

## 2014-11-23 DIAGNOSIS — Z8709 Personal history of other diseases of the respiratory system: Secondary | ICD-10-CM | POA: Diagnosis not present

## 2014-11-23 DIAGNOSIS — H9190 Unspecified hearing loss, unspecified ear: Secondary | ICD-10-CM | POA: Insufficient documentation

## 2014-11-23 DIAGNOSIS — Z8659 Personal history of other mental and behavioral disorders: Secondary | ICD-10-CM | POA: Diagnosis not present

## 2014-11-23 LAB — COMPREHENSIVE METABOLIC PANEL
ALT: 20 U/L (ref 14–54)
ANION GAP: 10 (ref 5–15)
AST: 28 U/L (ref 15–41)
Albumin: 3.6 g/dL (ref 3.5–5.0)
Alkaline Phosphatase: 93 U/L (ref 38–126)
BUN: 14 mg/dL (ref 6–20)
CALCIUM: 9.6 mg/dL (ref 8.9–10.3)
CO2: 20 mmol/L — ABNORMAL LOW (ref 22–32)
CREATININE: 0.6 mg/dL (ref 0.44–1.00)
Chloride: 109 mmol/L (ref 101–111)
GLUCOSE: 151 mg/dL — AB (ref 65–99)
Potassium: 4.6 mmol/L (ref 3.5–5.1)
SODIUM: 139 mmol/L (ref 135–145)
TOTAL PROTEIN: 7 g/dL (ref 6.5–8.1)
Total Bilirubin: 0.8 mg/dL (ref 0.3–1.2)

## 2014-11-23 LAB — CBC WITH DIFFERENTIAL/PLATELET
BASOS ABS: 0 10*3/uL (ref 0.0–0.1)
Basophils Relative: 0 % (ref 0–1)
EOS ABS: 0.1 10*3/uL (ref 0.0–0.7)
EOS PCT: 1 % (ref 0–5)
HCT: 42.7 % (ref 36.0–46.0)
HEMOGLOBIN: 15 g/dL (ref 12.0–15.0)
Lymphocytes Relative: 5 % — ABNORMAL LOW (ref 12–46)
Lymphs Abs: 0.7 10*3/uL (ref 0.7–4.0)
MCH: 29.7 pg (ref 26.0–34.0)
MCHC: 35.1 g/dL (ref 30.0–36.0)
MCV: 84.6 fL (ref 78.0–100.0)
MONO ABS: 0.8 10*3/uL (ref 0.1–1.0)
MONOS PCT: 5 % (ref 3–12)
NEUTROS ABS: 14.3 10*3/uL — AB (ref 1.7–7.7)
Neutrophils Relative %: 89 % — ABNORMAL HIGH (ref 43–77)
PLATELETS: 357 10*3/uL (ref 150–400)
RBC: 5.05 MIL/uL (ref 3.87–5.11)
RDW: 12.2 % (ref 11.5–15.5)
WBC: 16 10*3/uL — ABNORMAL HIGH (ref 4.0–10.5)

## 2014-11-23 LAB — POC OCCULT BLOOD, ED: Fecal Occult Bld: NEGATIVE

## 2014-11-23 MED ORDER — ONDANSETRON HCL 4 MG/2ML IJ SOLN
4.0000 mg | Freq: Once | INTRAMUSCULAR | Status: AC
  Administered 2014-11-23: 4 mg via INTRAVENOUS
  Filled 2014-11-23: qty 2

## 2014-11-23 MED ORDER — SODIUM CHLORIDE 0.9 % IV BOLUS (SEPSIS)
1000.0000 mL | Freq: Once | INTRAVENOUS | Status: AC
  Administered 2014-11-23: 1000 mL via INTRAVENOUS

## 2014-11-23 MED ORDER — DIPHENHYDRAMINE HCL 50 MG/ML IJ SOLN
25.0000 mg | Freq: Once | INTRAMUSCULAR | Status: AC
  Administered 2014-11-23: 25 mg via INTRAVENOUS
  Filled 2014-11-23: qty 1

## 2014-11-23 NOTE — ED Provider Notes (Signed)
History   Chief Complaint  Patient presents with  . Emesis    HPI 54 year old female past medical history of CP who presents to ED via EMS for evaluation of nausea/vomiting/diarrhea which began today. Patient's home health nurse noted patient's vomit to be dark in color but did not appear to be coffee ground. No bloody emesis reported. Patient denies any abdominal pain and reports that her stools have been loose and watery. She's had 3 episodes of emesis and diarrhea today. Prior to today she was in her usual state of health. She denies any fevers, chills, other symptoms. She reports using ibuprofen once daily and having no history of gastric ulcers. Patient states prior to today she was eating and drinking well. She reports having little intake today however. No other complaints at this time.  Onset of symptoms: gradual. Duration 1 day.  Modifying factors none.  Severity: moderate.  Associated symptoms: as above.  Hx of similar symptoms: no.    Past medical/surgical history, social history, medications, allergies and FH have been reviewed with patient and/or in documentation. Furthermore, if pt family or friend(s) present, additional historical information was obtained from them.  Past Medical History  Diagnosis Date  . Sinus disorder   . Hearing loss   . Anxiety   . Depression   . Cerebral palsy    Past Surgical History  Procedure Laterality Date  . Brain surgery     Family History  Problem Relation Age of Onset  . Family history unknown: Yes   History  Substance Use Topics  . Smoking status: Never Smoker   . Smokeless tobacco: Never Used  . Alcohol Use: 0.6 oz/week    1 Shots of liquor per week     Comment: occasionally     Review of Systems Constitutional: - F/C, -fatigue.  HENT: - congestion, -rhinorrhea, -sore throat.   Eyes: - eye pain, -visual disturbance.  Respiratory: - cough, -SOB, -hemoptysis.   Cardiovascular: - CP, -palps.  Gastrointestinal: + N/V/D, -abd  pain  Genitourinary: - flank pain, -dysuria, -frequency.  Musculoskeletal: - myalgia/arthritis, -joint swelling, -gait abnormality, -back pain, -neck pain/stiffness, -leg pain/swelling.  Skin: - rash/lesion.  Neurological: - focal weakness, -lightheadedness, -dizziness, -numbness, -HA.  All other systems reviewed and are negative.   Physical Exam  Physical Exam ED Triage Vitals  Enc Vitals Group     BP 11/23/14 1717 124/83 mmHg     Pulse Rate 11/23/14 1719 91     Resp 11/23/14 1815 24     Temp --      Temp src --      SpO2 11/23/14 1710 95 %     Weight 11/23/14 1715 120 lb (54.432 kg)     Height --      Head Cir --      Peak Flow --      Pain Score 11/23/14 1712 0     Pain Loc --      Pain Edu? --      Excl. in [place]? --     Constitutional: Patient is chronically ill appearing and in no acute distress Head: Normocephalic and atraumatic.  Eyes: Extraocular motion intact, no scleral icterus Mouth: dry mucus membranes, OP clear Neck: Supple without meningismus, mass, or overt JVD Respiratory: No respiratory distress. Normal WOB. No w/r/g. CV: RRR, no obvious murmurs.  Pulses +2 and symmetric. Euvolemic Abdomen: Soft, NT, ND, no r/g. No mass.  MSK: Extremities are atraumatic without deformity, ROM intact Skin: Warm, dry, intact  without rash Neuro: AAOx4, MAE 5/5 sym, no focal deficit noted  ED Course  Procedures   Labs Reviewed  CBC WITH DIFFERENTIAL/PLATELET - Abnormal; Notable for the following:    WBC 16.0 (*)    Neutrophils Relative % 89 (*)    Neutro Abs 14.3 (*)    Lymphocytes Relative 5 (*)    All other components within normal limits  COMPREHENSIVE METABOLIC PANEL - Abnormal; Notable for the following:    CO2 20 (*)    Glucose, Bld 151 (*)    All other components within normal limits  POC OCCULT BLOOD, ED   I personally reviewed and interpreted all labs.  No results found. I personally viewed above image(s) which were used in my medical decision making.  Formal interpretations by Radiology.   EKG Interpretation  Date/Time:    Ventricular Rate:    PR Interval:    QRS Duration:   QT Interval:    QTC Calculation:   R Axis:     Text Interpretation:         MDM: Carilyn Woolston is a 54 y.o. female with H&P as above who p/w CC: Nausea vomiting diarrhea  On arrival, patient is hemodynamically stable in no apparent distress with a benign exam. Patient does appear mildly dehydrated as her lips are dry. IVF given. Screening labs were sent and were notable for slight leukocytosis with left shift and this was seen on prior lab draws. Patient did receive IV fluids and Zofran has had no recurrent symptoms since receiving these. Patient reports she feels at baseline and wants to go home. Given well appearance, I feel this is ok. Patient remained stable in the ED and is stable for discharge with close PCP follow-up.  Old records reviewed (if available). Labs and imaging reviewed personally by myself and considered in medical decision making if ordered.  Clinical Impression: 1. Nausea vomiting and diarrhea     Disposition: Discharge  Condition: Good  I have discussed the results, Dx and Tx plan with the pt(& family if present). He/she/they expressed understanding and agree(s) with the plan. Discharge instructions discussed at great length. Strict return precautions discussed and pt &/or family have verbalized understanding of the instructions. No further questions at time of discharge.    New Prescriptions   No medications on file    Follow Up: Mayra Neer, MD [address] [place] [address] [phone]  Schedule an appointment as soon as possible for a visit in 2 days If not improved in 2 days  Mathis Woodruff [place] [phone]  If symptoms worsen   Pt seen in conjunction with Dr. Delora Fuel, MD  Kirstie Peri, [facility] Resident - PGY-3      Kirstie Peri, MD 30/94/07 [id number]  Delora Fuel, MD 81/10/31 5945

## 2014-11-23 NOTE — ED Notes (Signed)
Received pt via EMS from home with c/o N/V and loose stools onset today. Pt has home care RN who reports that pt was vomiting dark colored secretions.

## 2014-11-23 NOTE — ED Notes (Signed)
Discharge instructions and brat diet reviewed with caregiver, voiced understanding

## 2014-11-23 NOTE — Discharge Instructions (Signed)
Nausea and Vomiting °Nausea means you feel sick to your stomach. Throwing up (vomiting) is a reflex where stomach contents come out of your mouth. °HOME CARE  °· Take medicine as told by your doctor. °· Do not force yourself to eat. However, you do need to drink fluids. °· If you feel like eating, eat a normal diet as told by your doctor. °· Eat rice, wheat, potatoes, bread, lean meats, yogurt, fruits, and vegetables. °· Avoid high-fat foods. °· Drink enough fluids to keep your pee (urine) clear or pale yellow. °· Ask your doctor how to replace body fluid losses (rehydrate). Signs of body fluid loss (dehydration) include: °· Feeling very thirsty. °· Dry lips and mouth. °· Feeling dizzy. °· Dark pee. °· Peeing less than normal. °· Feeling confused. °· Fast breathing or heart rate. °GET HELP RIGHT AWAY IF:  °· You have blood in your throw up. °· You have black or bloody poop (stool). °· You have a bad headache or stiff neck. °· You feel confused. °· You have bad belly (abdominal) pain. °· You have chest pain or trouble breathing. °· You do not pee at least once every 8 hours. °· You have cold, clammy skin. °· You keep throwing up after 24 to 48 hours. °· You have a fever. °MAKE SURE YOU:  °· Understand these instructions. °· Will watch your condition. °· Will get help right away if you are not doing well or get worse. °Document Released: 09/21/2007 Document Revised: 06/27/2011 Document Reviewed: 09/03/2010 °ExitCare® Patient Information ©2015 [employer]. This information is not intended to replace advice given to you by your health care provider. Make sure you discuss any questions you have with your health care provider. ° °Diarrhea °Diarrhea is frequent loose and watery bowel movements. It can cause you to feel weak and dehydrated. Dehydration can cause you to become tired and thirsty, have a dry mouth, and have decreased urination that often is dark yellow. Diarrhea is a sign of another problem, most often an  infection that will not last long. In most cases, diarrhea typically lasts 2-3 days. However, it can last longer if it is a sign of something more serious. It is important to treat your diarrhea as directed by your caregiver to lessen or prevent future episodes of diarrhea. °CAUSES  °Some common causes include: °· Gastrointestinal infections caused by viruses, bacteria, or parasites. °· Food poisoning or food allergies. °· Certain medicines, such as antibiotics, chemotherapy, and laxatives. °· Artificial sweeteners and fructose. °· Digestive disorders. °HOME CARE INSTRUCTIONS °· Ensure adequate fluid intake (hydration): Have 1 cup (8 oz) of fluid for each diarrhea episode. Avoid fluids that contain simple sugars or sports drinks, fruit juices, whole milk products, and sodas. Your urine should be clear or pale yellow if you are drinking enough fluids. Hydrate with an oral rehydration solution that you can purchase at pharmacies, retail stores, and online. You can prepare an oral rehydration solution at home by mixing the following ingredients together: °¨  - tsp table salt. °¨ ¾ tsp baking soda. °¨  tsp salt substitute containing potassium chloride. °¨ 1  tablespoons sugar. °¨ 1 L (34 oz) of water. °· Certain foods and beverages may increase the speed at which food moves through the gastrointestinal (GI) tract. These foods and beverages should be avoided and include: °¨ Caffeinated and alcoholic beverages. °¨ High-fiber foods, such as raw fruits and vegetables, nuts, seeds, and whole grain breads and cereals. °¨ Foods and beverages sweetened with   sugar alcohols, such as xylitol, sorbitol, and mannitol. °· Some foods may be well tolerated and may help thicken stool including: °¨ Starchy foods, such as rice, toast, pasta, low-sugar cereal, oatmeal, grits, baked potatoes, crackers, and bagels. °¨ Bananas. °¨ Applesauce. °· Add probiotic-rich foods to help increase healthy bacteria in the GI tract, such as yogurt and  fermented milk products. °· Wash your hands well after each diarrhea episode. °· Only take over-the-counter or prescription medicines as directed by your caregiver. °· Take a warm bath to relieve any burning or pain from frequent diarrhea episodes. °SEEK IMMEDIATE MEDICAL CARE IF:  °· You are unable to keep fluids down. °· You have persistent vomiting. °· You have blood in your stool, or your stools are black and tarry. °· You do not urinate in 6-8 hours, or there is only a small amount of very dark urine. °· You have abdominal pain that increases or localizes. °· You have weakness, dizziness, confusion, or light-headedness. °· You have a severe headache. °· Your diarrhea gets worse or does not get better. °· You have a fever or persistent symptoms for more than 2-3 days. °· You have a fever and your symptoms suddenly get worse. °MAKE SURE YOU:  °· Understand these instructions. °· Will watch your condition. °· Will get help right away if you are not doing well or get worse. °Document Released: 03/25/2002 Document Revised: 08/19/2013 Document Reviewed: 12/11/2011 °ExitCare® Patient Information ©2015 [employer]. This information is not intended to replace advice given to you by your health care provider. Make sure you discuss any questions you have with your health care provider. ° °

## 2014-11-27 NOTE — Progress Notes (Signed)
Patient ID: Tamara Curry, female   DOB: 16-Sep-1960, 54 y.o.   MRN: [id number]  Subjective: 54 year old female presents the office today for follow-up evaluation of right first MTPJ joint pain and inflammation. When the patient's caregiver she states that she believes that the inflammation is almost resolved and her foot is back to what it was before this started. The patient doesn't she continues to get pain along the prominent bunion and hammertoes. She is asking if I could "break the toes" to fix them. Denies any acute changes his last appointment and no other complaints at this time.  Objective: Awake, alert, NAD DP/PT pulses palpable, CRT less than 3 seconds There is significant HAV b/l with  bilateral hammertoe contractures. There is some mild inflammation around the medial facet of the first metatarsal head of the right foot over the prominent bunion deformity although inflammation is significantly improved. There is no areas of fluctuance or crepitus. There is no drainable collection. There is no evidence of bursa or any fluid around the area. There is no overlying erythema, increase in warmth. No other areas of tenderness of bilateral lower extremity is. No open lesions or pre-ulcerative lesions. No pain with calf compression, swelling, warmth, erythema.  Assessment: 54 year old female with resolved right first MTPJ inflammation/pain  Plan: -Treatment options discussed including all alternatives, risks, and complications -At this time the symptoms in the right foot have greatly improved. Continues mild for any recurrence. -I discussed surgery with her although I told that she is a good candidate. -At this time we'll go back on a regular schedule for routine care. In the meantime I encouraged her to call the office with any questions, concerns, change in symptoms or there is any recurrence.  Celesta Gentile, DPM

## 2014-12-10 DIAGNOSIS — F348 Other persistent mood [affective] disorders: Secondary | ICD-10-CM | POA: Diagnosis not present

## 2014-12-10 DIAGNOSIS — F918 Other conduct disorders: Secondary | ICD-10-CM | POA: Diagnosis not present

## 2014-12-10 DIAGNOSIS — F411 Generalized anxiety disorder: Secondary | ICD-10-CM | POA: Diagnosis not present

## 2014-12-19 ENCOUNTER — Ambulatory Visit: Payer: Medicare Other | Admitting: Podiatry

## 2015-01-12 DIAGNOSIS — B369 Superficial mycosis, unspecified: Secondary | ICD-10-CM | POA: Diagnosis not present

## 2015-01-12 DIAGNOSIS — H6121 Impacted cerumen, right ear: Secondary | ICD-10-CM | POA: Diagnosis not present

## 2015-01-12 DIAGNOSIS — H906 Mixed conductive and sensorineural hearing loss, bilateral: Secondary | ICD-10-CM | POA: Diagnosis not present

## 2015-01-12 DIAGNOSIS — H6242 Otitis externa in other diseases classified elsewhere, left ear: Secondary | ICD-10-CM | POA: Diagnosis not present

## 2015-02-11 DIAGNOSIS — H6122 Impacted cerumen, left ear: Secondary | ICD-10-CM | POA: Diagnosis not present

## 2015-02-11 DIAGNOSIS — H906 Mixed conductive and sensorineural hearing loss, bilateral: Secondary | ICD-10-CM | POA: Diagnosis not present

## 2015-02-11 NOTE — Progress Notes (Signed)
Patient ID: Tamara Curry, female   DOB: 1960-06-03, 54 y.o.   MRN: [id number] Error encounter

## 2015-02-26 DIAGNOSIS — Z23 Encounter for immunization: Secondary | ICD-10-CM | POA: Diagnosis not present

## 2015-02-27 ENCOUNTER — Ambulatory Visit: Payer: Medicare Other | Admitting: Podiatry

## 2015-03-02 ENCOUNTER — Encounter: Payer: Self-pay | Admitting: Podiatry

## 2015-03-02 ENCOUNTER — Ambulatory Visit (INDEPENDENT_AMBULATORY_CARE_PROVIDER_SITE_OTHER): Payer: Medicare Other | Admitting: Podiatry

## 2015-03-02 DIAGNOSIS — M2040 Other hammer toe(s) (acquired), unspecified foot: Secondary | ICD-10-CM | POA: Diagnosis not present

## 2015-03-02 DIAGNOSIS — M79676 Pain in unspecified toe(s): Secondary | ICD-10-CM

## 2015-03-02 DIAGNOSIS — M2010 Hallux valgus (acquired), unspecified foot: Secondary | ICD-10-CM

## 2015-03-02 DIAGNOSIS — B351 Tinea unguium: Secondary | ICD-10-CM | POA: Diagnosis not present

## 2015-03-02 DIAGNOSIS — G6289 Other specified polyneuropathies: Secondary | ICD-10-CM | POA: Diagnosis not present

## 2015-03-03 NOTE — Progress Notes (Signed)
Patient ID: Tamara Curry, female   DOB: [birth year], 54 y.o.   MRN: [id number]  Subjective: 54 year old female presents the office they for follow-up evaluation of right first MTPJ and bunion pain. She states the majority of her problems over the second toe overlapping the big toe. She was discussed possible surgical intervention. She states of the big toe swelling has significantly improved. She denies any recent injury or trauma. She said no other treatment other than a pad intermittently which seems to help. She also states her nails are painful elongated. She does try to pick her nails off herself at times when they become long. No other complaints at this time.  Objective: Awake, alert, NAD DP/PT pulses palpable, CRT less than 3 seconds There is significant HAV deformity present hammertoe contractures bilaterally. The second digit overlapping the hallux. There does be be some swelling to the right first MTPJ although does appear to be significantly improved compared to what it was previously. There is no overlying erythema or increase in warmth. There is no fluid collection present along the bunion site no bursa formation is present. There is no pain with MPJ range of motion. There is no other open lesions or pre-ulcerative lesions. The nails do appear to be hypertrophic, dystrophic, discolored and elongated except for the left second and right third toenails which have previously been removed from what she states that she is picked off. There is toenail pain to the remainder the toenails. There is no surrounding erythema or drainage on in the nails or nail sites. No open lesions or pre-ulcerative lesions. There is no pain with calf compression, swelling, warmth, erythema.  Assessment: 54 year old female with multiple lesser digit contractures with HAV, symptomatic onychomycosis  Plan: -Treatment options discussed including all alternatives, risks, and complications -At this time I recommended  offloading shoes and padding. Prescription for shoes and surgical were given to the patient. She will discuss surgical intervention averaging up with that she is a good surgical candidate. We will attempt all forms of conservative treatment for now. -Nails are sharply debrided without complications/bleeding 8. Strongly encouraged her not to remove her toenails herself. -Monitor for any clinical signs or symptoms of infection and directed to call the office immediately should any occur or go to the ER. -Follow-up in 3 months or sooner if any problems arise. In the meantime, encouraged to call the office with any questions, concerns, change in symptoms.   Celesta Gentile, DPM

## 2015-03-24 DIAGNOSIS — B369 Superficial mycosis, unspecified: Secondary | ICD-10-CM | POA: Diagnosis not present

## 2015-03-24 DIAGNOSIS — H6240 Otitis externa in other diseases classified elsewhere, unspecified ear: Secondary | ICD-10-CM | POA: Diagnosis not present

## 2015-03-24 DIAGNOSIS — H6123 Impacted cerumen, bilateral: Secondary | ICD-10-CM | POA: Diagnosis not present

## 2015-03-24 DIAGNOSIS — H6242 Otitis externa in other diseases classified elsewhere, left ear: Secondary | ICD-10-CM | POA: Diagnosis not present

## 2015-03-24 DIAGNOSIS — H906 Mixed conductive and sensorineural hearing loss, bilateral: Secondary | ICD-10-CM | POA: Diagnosis not present

## 2015-04-21 ENCOUNTER — Other Ambulatory Visit: Payer: Self-pay

## 2015-04-21 DIAGNOSIS — Z1231 Encounter for screening mammogram for malignant neoplasm of breast: Secondary | ICD-10-CM

## 2015-04-29 DIAGNOSIS — F411 Generalized anxiety disorder: Secondary | ICD-10-CM | POA: Diagnosis not present

## 2015-04-29 DIAGNOSIS — F3481 Disruptive mood dysregulation disorder: Secondary | ICD-10-CM | POA: Diagnosis not present

## 2015-04-29 DIAGNOSIS — F70 Mild intellectual disabilities: Secondary | ICD-10-CM | POA: Diagnosis not present

## 2015-04-29 DIAGNOSIS — F918 Other conduct disorders: Secondary | ICD-10-CM | POA: Diagnosis not present

## 2015-05-11 ENCOUNTER — Ambulatory Visit (INDEPENDENT_AMBULATORY_CARE_PROVIDER_SITE_OTHER): Payer: Medicare Other | Admitting: Podiatry

## 2015-05-11 ENCOUNTER — Encounter: Payer: Self-pay | Admitting: Podiatry

## 2015-05-11 DIAGNOSIS — B351 Tinea unguium: Secondary | ICD-10-CM | POA: Diagnosis not present

## 2015-05-11 DIAGNOSIS — M79676 Pain in unspecified toe(s): Secondary | ICD-10-CM | POA: Diagnosis not present

## 2015-05-11 NOTE — Progress Notes (Signed)
Patient ID: Tamara Curry, female   DOB: 11-Oct-1960, 55 y.o.   MRN: [id number]  Subjective: 55 y.o. returns the office today for painful, elongated, thickened toenails which she cannot trim herself. Denies any redness or drainage around the nails. She also states that she continues of painful bunions and hammertoes. Her caregiver states that they have tried changing her shoes to a more soft material which seems to help. Her caregiver believes that her feet are doing better. Denies any acute changes since last appointment and no new complaints today. Denies any systemic complaints such as fevers, chills, nausea, vomiting.   Objective: Awake, alert, NAD DP/PT pulses palpable, CRT less than 3 seconds Nails hypertrophic, dystrophic, elongated, brittle, discolored 10. There is tenderness overlying the nails 1-5 bilaterally. There is no surrounding erythema or drainage along the nail sites. No open lesions or pre-ulcerative lesions are identified. There is significant HAV deformity present bilaterally hammertoe contractures. There is no edema, erythema, fluctuance overlying the area at this time. No other areas of tenderness bilateral lower extremities. No overlying edema, erythema, increased warmth. No pain with calf compression, swelling, warmth, erythema.  Assessment: Patient presents with symptomatic onychomycosis, HAV/hammertoes  Plan: -Treatment options including alternatives, risks, complications were discussed -At this time I discussed surgical management the patient at her request. I do not believe that she would likely do well from the surgery given the extensive her deformity. I recommend continued conservative treatment. -Nails sharply debrided 10 without complication/bleeding. -Discussed daily foot inspection. If there are any changes, to call the office immediately.  -Follow-up in 3 months or sooner if any problems are to arise. In the meantime, encouraged to call the office with any  questions, concerns, changes symptoms.  Celesta Gentile, DPM

## 2015-07-02 ENCOUNTER — Ambulatory Visit
Admission: RE | Admit: 2015-07-02 | Discharge: 2015-07-02 | Disposition: A | Discharge status: Home / Self Care | Payer: Medicare Other | Source: Ambulatory Visit

## 2015-07-02 DIAGNOSIS — Z1231 Encounter for screening mammogram for malignant neoplasm of breast: Secondary | ICD-10-CM | POA: Diagnosis not present

## 2015-07-07 DIAGNOSIS — E781 Pure hyperglyceridemia: Secondary | ICD-10-CM | POA: Diagnosis not present

## 2015-07-07 DIAGNOSIS — Z0001 Encounter for general adult medical examination with abnormal findings: Secondary | ICD-10-CM | POA: Diagnosis not present

## 2015-07-07 DIAGNOSIS — B351 Tinea unguium: Secondary | ICD-10-CM | POA: Diagnosis not present

## 2015-07-07 DIAGNOSIS — M21619 Bunion of unspecified foot: Secondary | ICD-10-CM | POA: Diagnosis not present

## 2015-07-07 DIAGNOSIS — Z1211 Encounter for screening for malignant neoplasm of colon: Secondary | ICD-10-CM | POA: Diagnosis not present

## 2015-07-07 DIAGNOSIS — G809 Cerebral palsy, unspecified: Secondary | ICD-10-CM | POA: Diagnosis not present

## 2015-07-07 DIAGNOSIS — F411 Generalized anxiety disorder: Secondary | ICD-10-CM | POA: Diagnosis not present

## 2015-08-10 ENCOUNTER — Ambulatory Visit (INDEPENDENT_AMBULATORY_CARE_PROVIDER_SITE_OTHER): Payer: Medicare Other | Admitting: Podiatry

## 2015-08-10 DIAGNOSIS — G6289 Other specified polyneuropathies: Secondary | ICD-10-CM

## 2015-08-10 DIAGNOSIS — B351 Tinea unguium: Secondary | ICD-10-CM | POA: Diagnosis not present

## 2015-08-10 DIAGNOSIS — M79676 Pain in unspecified toe(s): Secondary | ICD-10-CM

## 2015-08-11 NOTE — Progress Notes (Signed)
Patient ID: Tamara Curry, female   DOB: 1961-01-15, 55 y.o.   MRN: [id number]  Subjective: 55 y.o. returns the office today for painful, elongated, thickened toenails which she cannot trim herself. Denies any redness or drainage around the nails.She has purchased new shoes to help with the bunions. Denies any acute changes since last appointment and no new complaints today. Denies any systemic complaints such as fevers, chills, nausea, vomiting.   Objective: Awake, alert, NAD DP/PT pulses palpable, CRT less than 3 seconds Nails hypertrophic, dystrophic, elongated, brittle, discolored 10. There is tenderness overlying the nails 1-5 bilaterally. There is no surrounding erythema or drainage along the nail sites. No open lesions or pre-ulcerative lesions are identified. There is significant HAV deformity present bilaterally hammertoe contractures. There is no edema, erythema, fluctuance overlying the area at this time. No other areas of tenderness bilateral lower extremities. No overlying edema, erythema, increased warmth. No pain with calf compression, swelling, warmth, erythema.  Assessment: Patient presents with symptomatic onychomycosis, HAV/hammertoes  Plan: -Treatment options including alternatives, risks, complications were discussed -Nails sharply debrided 10 without complication/bleeding. -Further discuss shoe gear modifications to help accommodate her foot type. -Discussed daily foot inspection. If there are any changes, to call the office immediately.  -Follow-up in 3 months or sooner if any problems are to arise. In the meantime, encouraged to call the office with any questions, concerns, changes symptoms.  Celesta Gentile, DPM

## 2015-10-28 DIAGNOSIS — F419 Anxiety disorder, unspecified: Secondary | ICD-10-CM | POA: Diagnosis not present

## 2015-10-28 DIAGNOSIS — F918 Other conduct disorders: Secondary | ICD-10-CM | POA: Diagnosis not present

## 2015-10-28 DIAGNOSIS — F3489 Other specified persistent mood disorders: Secondary | ICD-10-CM | POA: Diagnosis not present

## 2015-11-02 DIAGNOSIS — D125 Benign neoplasm of sigmoid colon: Secondary | ICD-10-CM | POA: Diagnosis not present

## 2015-11-02 DIAGNOSIS — Z86010 Personal history of colonic polyps: Secondary | ICD-10-CM | POA: Diagnosis not present

## 2015-11-02 DIAGNOSIS — D126 Benign neoplasm of colon, unspecified: Secondary | ICD-10-CM | POA: Diagnosis not present

## 2015-11-02 DIAGNOSIS — K5730 Diverticulosis of large intestine without perforation or abscess without bleeding: Secondary | ICD-10-CM | POA: Diagnosis not present

## 2015-11-16 ENCOUNTER — Ambulatory Visit (INDEPENDENT_AMBULATORY_CARE_PROVIDER_SITE_OTHER): Payer: Medicare Other | Admitting: Podiatry

## 2015-11-16 ENCOUNTER — Encounter: Payer: Self-pay | Admitting: Podiatry

## 2015-11-16 DIAGNOSIS — M2010 Hallux valgus (acquired), unspecified foot: Secondary | ICD-10-CM

## 2015-11-16 DIAGNOSIS — B351 Tinea unguium: Secondary | ICD-10-CM

## 2015-11-16 DIAGNOSIS — M79676 Pain in unspecified toe(s): Secondary | ICD-10-CM | POA: Diagnosis not present

## 2015-11-16 NOTE — Progress Notes (Signed)
Patient ID: Tamara Curry, female   DOB: 1961-02-18, 55 y.o.   MRN: [id number]  Subjective: 55 y.o. returns the office today for painful, elongated, thickened toenails which she cannot trim herself. Denies any redness or drainage around the nails. She states she wants bunion surgery. Her caregiver states that she does not complain of pain to the bunion or the toes. Denies any acute changes since last appointment and no new complaints today. Denies any systemic complaints such as fevers, chills, nausea, vomiting.   Objective: Awake, alert, NAD DP/PT pulses palpable, CRT less than 3 seconds Nails hypertrophic, dystrophic, elongated, brittle, discolored 10. There is tenderness overlying the nails 1-5 bilaterally. There is no surrounding erythema or drainage along the nail sites. No open lesions or pre-ulcerative lesions are identified. There is significant HAV deformity present bilaterally hammertoe contractures. There is no edema, erythema, fluctuance overlying the area at this time. No other areas of tenderness bilateral lower extremities. No overlying edema, erythema, increased warmth. No pain with calf compression, swelling, warmth, erythema.  Assessment: Patient presents with symptomatic onychomycosis, HAV/hammertoes  Plan: -Treatment options including alternatives, risks, complications were discussed -Nails sharply debrided 10 without complication/bleeding. -Dispensed bunion pads to help take pressure off this area. She is not ambulatory that much and she apparently has not been complaining of pain. Would recommend holding off on surgical intervention at this time. -Discussedssed daily foot inspection. If there are any changes, to call the office immediately.  -Follow-up in 3 months or sooner if any problems are to arise. In the meantime, encouraged to call the office with any questions, concerns, changes symptoms.  Celesta Gentile, DPM

## 2015-12-15 DIAGNOSIS — F918 Other conduct disorders: Secondary | ICD-10-CM | POA: Diagnosis not present

## 2015-12-15 DIAGNOSIS — F3489 Other specified persistent mood disorders: Secondary | ICD-10-CM | POA: Diagnosis not present

## 2015-12-15 DIAGNOSIS — F419 Anxiety disorder, unspecified: Secondary | ICD-10-CM | POA: Diagnosis not present

## 2016-01-18 DIAGNOSIS — Z23 Encounter for immunization: Secondary | ICD-10-CM | POA: Diagnosis not present

## 2016-01-18 DIAGNOSIS — F411 Generalized anxiety disorder: Secondary | ICD-10-CM | POA: Diagnosis not present

## 2016-02-15 ENCOUNTER — Ambulatory Visit (INDEPENDENT_AMBULATORY_CARE_PROVIDER_SITE_OTHER): Payer: Medicare Other | Admitting: Podiatry

## 2016-02-15 ENCOUNTER — Encounter: Payer: Self-pay | Admitting: Podiatry

## 2016-02-15 DIAGNOSIS — B351 Tinea unguium: Secondary | ICD-10-CM

## 2016-02-15 DIAGNOSIS — M79676 Pain in unspecified toe(s): Secondary | ICD-10-CM

## 2016-02-15 DIAGNOSIS — M2042 Other hammer toe(s) (acquired), left foot: Secondary | ICD-10-CM | POA: Diagnosis not present

## 2016-02-15 DIAGNOSIS — M2041 Other hammer toe(s) (acquired), right foot: Secondary | ICD-10-CM | POA: Diagnosis not present

## 2016-02-15 DIAGNOSIS — G6289 Other specified polyneuropathies: Secondary | ICD-10-CM

## 2016-02-15 DIAGNOSIS — M2010 Hallux valgus (acquired), unspecified foot: Secondary | ICD-10-CM

## 2016-02-16 NOTE — Progress Notes (Signed)
Patient ID: Tamara Curry, female   DOB: Aug 08, 1960, 55 y.o.   MRN: [id number]  Subjective: 55 y.o. returns the office today for painful, elongated, thickened toenails which she cannot trim herself. Denies any redness or drainage around the nails.she believes it of her toe was straight she would walk better. Denies any acute changes since last appointment and no new complaints today. Denies any systemic complaints such as fevers, chills, nausea, vomiting.   Objective: Awake, alert, NAD DP/PT pulses palpable, CRT less than 3 seconds Nails hypertrophic, dystrophic, elongated, brittle, discolored 10. There is tenderness overlying the nails 1-5 bilaterally. There is no surrounding erythema or drainage along the nail sites. No open lesions or pre-ulcerative lesions are identified. There is significant HAV deformity present bilaterally hammertoe contractures. There is no edema, erythema, fluctuance overlying the area at this time. No other areas of tenderness bilateral lower extremities. No overlying edema, erythema, increased warmth. No pain with calf compression, swelling, warmth, erythema.  Assessment: Patient presents with symptomatic onychomycosis, HAV/hammertoes  Plan: -Treatment options including alternatives, risks, complications were discussed -Nails sharply debrided 10 without complication/bleeding. -Will try a custom molded shoe and insert- Rx for [employer] given.  -Discussedssed daily foot inspection. If there are any changes, to call the office immediately.  -Follow-up in 3 months or sooner if any problems are to arise. In the meantime, encouraged to call the office with any questions, concerns, changes symptoms.  Celesta Gentile, DPM

## 2016-02-18 ENCOUNTER — Telehealth: Payer: Self-pay | Admitting: *Deleted

## 2016-02-18 NOTE — Telephone Encounter (Addendum)
Rhonda called 02/16/2016, request name of facility for custom made shoe, that accept Medicaid. 02/17/2016-I spoke with Palma Holter as to her experience was there a custom made shoe facility that accepted Medicaid, she stated no. 02/18/2016-Unable to leave message informing Suanne Marker of this information due to the mailbox is full. I spoke with Suanne Marker she states pt is not diabetic and why would Dr. Jacqualyn Posey write for diabetic shoes. I told her pt may have structures in her feet that required a wider, deeper or even softer structure to take pressure or rubbing of the foot, diabetic shoes offer those properties. Rhonda asked how much the diabetic shoes in our office cost. I told her $190.00 without the inserts, and $540.00 with the custom molded inserts.

## 2016-02-18 NOTE — Telephone Encounter (Signed)
I have them a rx for [facility]. I don't think [employer] does Medicaid

## 2016-03-15 DIAGNOSIS — F419 Anxiety disorder, unspecified: Secondary | ICD-10-CM | POA: Diagnosis not present

## 2016-03-15 DIAGNOSIS — F918 Other conduct disorders: Secondary | ICD-10-CM | POA: Diagnosis not present

## 2016-03-15 DIAGNOSIS — F3489 Other specified persistent mood disorders: Secondary | ICD-10-CM | POA: Diagnosis not present

## 2016-05-18 DIAGNOSIS — F411 Generalized anxiety disorder: Secondary | ICD-10-CM | POA: Diagnosis not present

## 2016-05-18 DIAGNOSIS — R0981 Nasal congestion: Secondary | ICD-10-CM | POA: Diagnosis not present

## 2016-05-18 DIAGNOSIS — L918 Other hypertrophic disorders of the skin: Secondary | ICD-10-CM | POA: Diagnosis not present

## 2016-05-18 DIAGNOSIS — R32 Unspecified urinary incontinence: Secondary | ICD-10-CM | POA: Diagnosis not present

## 2016-05-19 ENCOUNTER — Ambulatory Visit (INDEPENDENT_AMBULATORY_CARE_PROVIDER_SITE_OTHER): Payer: Medicare Other | Admitting: Podiatry

## 2016-05-19 DIAGNOSIS — M79676 Pain in unspecified toe(s): Secondary | ICD-10-CM

## 2016-05-19 DIAGNOSIS — G6289 Other specified polyneuropathies: Secondary | ICD-10-CM

## 2016-05-19 DIAGNOSIS — B351 Tinea unguium: Secondary | ICD-10-CM | POA: Diagnosis not present

## 2016-05-25 NOTE — Progress Notes (Signed)
Patient ID: Tamara Curry, female   DOB: [birth year], 56 y.o.   MRN: [id number]  Subjective: 56 y.o. returns the office today for painful, elongated, thickened toenails which she cannot trim herself. Denies any redness or drainage around the nails.She states that her new shoes are fitting well and her feet feel better. Denies any acute changes since last appointment and no new complaints today. Denies any systemic complaints such as fevers, chills, nausea, vomiting.   Objective: Awake, alert, NAD DP/PT pulses palpable, CRT less than 3 seconds Nails hypertrophic, dystrophic, elongated, brittle, discolored 10. There is tenderness overlying the nails 1-5 bilaterally. There is no surrounding erythema or drainage along the nail sites. No open lesions or pre-ulcerative lesions are identified. There is significant HAV deformity present bilaterally hammertoe contractures. There is no edema, erythema, fluctuance overlying the area at this time. No other areas of tenderness bilateral lower extremities. No overlying edema, erythema, increased warmth. No pain with calf compression, swelling, warmth, erythema.  Assessment: Patient presents with symptomatic onychomycosis  Plan: -Treatment options including alternatives, risks, complications were discussed -Nails sharply debrided 10 without complication/bleeding. -Continue with her new shoes. This has been helping.  -Discussed daily foot inspection. If there are any changes, to call the office immediately.  -Follow-up in 3 months or sooner if any problems are to arise. In the meantime, encouraged to call the office with any questions, concerns, changes symptoms.  Celesta Gentile, DPM

## 2016-06-22 DIAGNOSIS — F341 Dysthymic disorder: Secondary | ICD-10-CM | POA: Diagnosis not present

## 2016-06-22 DIAGNOSIS — F410 Panic disorder [episodic paroxysmal anxiety] without agoraphobia: Secondary | ICD-10-CM | POA: Diagnosis not present

## 2016-07-20 DIAGNOSIS — F410 Panic disorder [episodic paroxysmal anxiety] without agoraphobia: Secondary | ICD-10-CM | POA: Diagnosis not present

## 2016-07-20 DIAGNOSIS — F341 Dysthymic disorder: Secondary | ICD-10-CM | POA: Diagnosis not present

## 2016-08-18 ENCOUNTER — Ambulatory Visit: Payer: Medicare Other | Admitting: Podiatry

## 2016-08-29 DIAGNOSIS — Z0000 Encounter for general adult medical examination without abnormal findings: Secondary | ICD-10-CM | POA: Diagnosis not present

## 2016-08-29 DIAGNOSIS — Z1159 Encounter for screening for other viral diseases: Secondary | ICD-10-CM | POA: Diagnosis not present

## 2016-08-29 DIAGNOSIS — G809 Cerebral palsy, unspecified: Secondary | ICD-10-CM | POA: Diagnosis not present

## 2016-08-29 DIAGNOSIS — R32 Unspecified urinary incontinence: Secondary | ICD-10-CM | POA: Diagnosis not present

## 2016-08-29 DIAGNOSIS — F411 Generalized anxiety disorder: Secondary | ICD-10-CM | POA: Diagnosis not present

## 2016-08-29 DIAGNOSIS — E781 Pure hyperglyceridemia: Secondary | ICD-10-CM | POA: Diagnosis not present

## 2016-08-30 ENCOUNTER — Other Ambulatory Visit: Payer: Self-pay | Admitting: Family Medicine

## 2016-08-30 DIAGNOSIS — Z1231 Encounter for screening mammogram for malignant neoplasm of breast: Secondary | ICD-10-CM

## 2016-08-31 ENCOUNTER — Ambulatory Visit: Payer: Medicare Other | Admitting: Podiatry

## 2016-09-14 DIAGNOSIS — F341 Dysthymic disorder: Secondary | ICD-10-CM | POA: Diagnosis not present

## 2016-09-14 DIAGNOSIS — F410 Panic disorder [episodic paroxysmal anxiety] without agoraphobia: Secondary | ICD-10-CM | POA: Diagnosis not present

## 2016-09-20 ENCOUNTER — Ambulatory Visit (INDEPENDENT_AMBULATORY_CARE_PROVIDER_SITE_OTHER): Payer: Medicare Other | Admitting: Podiatry

## 2016-09-20 ENCOUNTER — Encounter: Payer: Self-pay | Admitting: Podiatry

## 2016-09-20 DIAGNOSIS — B351 Tinea unguium: Secondary | ICD-10-CM

## 2016-09-20 DIAGNOSIS — M2041 Other hammer toe(s) (acquired), right foot: Secondary | ICD-10-CM

## 2016-09-20 DIAGNOSIS — M79676 Pain in unspecified toe(s): Secondary | ICD-10-CM

## 2016-09-20 DIAGNOSIS — M2010 Hallux valgus (acquired), unspecified foot: Secondary | ICD-10-CM

## 2016-09-20 DIAGNOSIS — M2042 Other hammer toe(s) (acquired), left foot: Secondary | ICD-10-CM

## 2016-09-20 NOTE — Progress Notes (Signed)
Complaint:  Visit Type: Patient returns to my office for continued preventative foot care services. Complaint: Patient states" my nails have grown long and thick and become painful to walk and wear shoes"  The patient presents for preventative foot care services. No changes to ROS  Podiatric Exam: Vascular: dorsalis pedis and posterior tibial pulses are palpable bilateral. Capillary return is immediate. Temperature gradient is WNL. Skin turgor WNL  Sensorium: Normal Semmes Weinstein monofilament test. Normal tactile sensation bilaterally. Nail Exam: Pt has thick disfigured discolored nails with subungual debris noted bilateral entire nail hallux through fifth toenails Ulcer Exam: There is no evidence of ulcer or pre-ulcerative changes or infection. Orthopedic Exam: Muscle tone and strength are WNL. No limitations in general ROM. No crepitus or effusions noted.  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 Skin: No Porokeratosis. No infection or ulcers  Diagnosis:  Onychomycosis, , Pain in right toe, pain in left toes  Treatment & Plan Procedures and Treatment: Consent by patient was obtained for treatment procedures. The patient understood the discussion of treatment and procedures well. All questions were answered thoroughly reviewed. Debridement of mycotic and hypertrophic toenails, 1 through 5 bilateral and clearing of subungual debris. No ulceration, no infection noted. Patient pointed to bunion as point of paon right foot. Return Visit-Office Procedure: Patient instructed to return to the office for a follow up visit 3 months for continued evaluation and treatment.    Gardiner Barefoot DPM

## 2016-09-29 ENCOUNTER — Ambulatory Visit
Admission: RE | Admit: 2016-09-29 | Discharge: 2016-09-29 | Disposition: A | Discharge status: Home / Self Care | Payer: Medicare Other | Source: Ambulatory Visit | Attending: Family Medicine | Admitting: Family Medicine

## 2016-09-29 DIAGNOSIS — Z1231 Encounter for screening mammogram for malignant neoplasm of breast: Secondary | ICD-10-CM | POA: Diagnosis not present

## 2016-11-08 DIAGNOSIS — F341 Dysthymic disorder: Secondary | ICD-10-CM | POA: Diagnosis not present

## 2016-11-08 DIAGNOSIS — F410 Panic disorder [episodic paroxysmal anxiety] without agoraphobia: Secondary | ICD-10-CM | POA: Diagnosis not present

## 2016-11-24 DIAGNOSIS — H40033 Anatomical narrow angle, bilateral: Secondary | ICD-10-CM | POA: Diagnosis not present

## 2016-11-24 DIAGNOSIS — H04123 Dry eye syndrome of bilateral lacrimal glands: Secondary | ICD-10-CM | POA: Diagnosis not present

## 2016-12-22 ENCOUNTER — Ambulatory Visit (INDEPENDENT_AMBULATORY_CARE_PROVIDER_SITE_OTHER): Payer: Medicare Other | Admitting: Podiatry

## 2016-12-22 ENCOUNTER — Encounter: Payer: Self-pay | Admitting: Podiatry

## 2016-12-22 DIAGNOSIS — B351 Tinea unguium: Secondary | ICD-10-CM | POA: Diagnosis not present

## 2016-12-22 DIAGNOSIS — M79676 Pain in unspecified toe(s): Secondary | ICD-10-CM | POA: Diagnosis not present

## 2016-12-23 NOTE — Progress Notes (Signed)
Subjective: 55 y.o. returns the office today for painful, elongated, thickened toenails which she cannot trim herself. Denies any redness or drainage around the nails. Denies any acute changes since last appointment and no new complaints today. Denies any systemic complaints such as fevers, chills, nausea, vomiting.   Objective: NAD, presents with caregiver DP/PT pulses palpable, CRT less than 3 seconds Nails hypertrophic, dystrophic, elongated, brittle, discolored 10. There is tenderness overlying the nails 1-5 bilaterally. There is no surrounding erythema or drainage along the nail sites. Mild interdigital maceration present. No skin breakdown, drainage, redness or signs of infection.  No open lesions or pre-ulcerative lesions are identified. Significant HAV b/l No other areas of tenderness bilateral lower extremities. No overlying edema, erythema, increased warmth. No pain with calf compression, swelling, warmth, erythema.  Assessment: Patient presents with symptomatic onychomycosis; interdigital maceration  Plan: -Treatment options including alternatives, risks, complications were discussed -Nails sharply debrided 10 without complication/bleeding. -Castellani's pain was applied interdigitally. Dry thoroughly between the toes. -Discussed daily foot inspection. If there are any changes, to call the office immediately.  -Follow-up in 3 months or sooner if any problems are to arise. In the meantime, encouraged to call the office with any questions, concerns, changes symptoms.  Celesta Gentile, DPM

## 2017-01-24 DIAGNOSIS — Z23 Encounter for immunization: Secondary | ICD-10-CM | POA: Diagnosis not present

## 2017-01-31 DIAGNOSIS — F341 Dysthymic disorder: Secondary | ICD-10-CM | POA: Diagnosis not present

## 2017-01-31 DIAGNOSIS — F410 Panic disorder [episodic paroxysmal anxiety] without agoraphobia: Secondary | ICD-10-CM | POA: Diagnosis not present

## 2017-03-23 ENCOUNTER — Encounter: Payer: Self-pay | Admitting: Podiatry

## 2017-03-23 ENCOUNTER — Ambulatory Visit (INDEPENDENT_AMBULATORY_CARE_PROVIDER_SITE_OTHER): Payer: Medicare Other | Admitting: Podiatry

## 2017-03-23 DIAGNOSIS — M79676 Pain in unspecified toe(s): Secondary | ICD-10-CM

## 2017-03-23 DIAGNOSIS — B351 Tinea unguium: Secondary | ICD-10-CM

## 2017-03-27 NOTE — Progress Notes (Signed)
Subjective: 56 y.o. returns the office today for painful, elongated, thickened toenails which she cannot trim herself. Denies any redness or drainage around the nails. Denies any acute changes since last appointment and no new complaints today. Denies any systemic complaints such as fevers, chills, nausea, vomiting.   Objective: NAD, presents with caregiver DP/PT pulses palpable, CRT less than 3 seconds Nails hypertrophic, dystrophic, elongated, brittle, discolored 10. There is tenderness overlying the nails 1-5 bilaterally. There is no surrounding erythema or drainage along the nail sites. Mild interdigital maceration present. No skin breakdown, drainage, redness or signs of infection.  No open lesions or pre-ulcerative lesions are identified. Significant HAV b/l No other areas of tenderness bilateral lower extremities. No overlying edema, erythema, increased warmth. No pain with calf compression, swelling, warmth, erythema.  Assessment: Patient presents with symptomatic onychomycosis; interdigital maceration  Plan: -Treatment options including alternatives, risks, complications were discussed -Nails sharply debrided 10 without complication/bleeding. -Castellani's pain was again applied interdigitally. Dry thoroughly between the toes. -Discussed daily foot inspection.  -Follow-up in 3 months or sooner if any problems are to arise. In the meantime, encouraged to call the office with any questions, concerns, changes symptoms.  Celesta Gentile, DPM

## 2017-04-25 DIAGNOSIS — F410 Panic disorder [episodic paroxysmal anxiety] without agoraphobia: Secondary | ICD-10-CM | POA: Diagnosis not present

## 2017-04-25 DIAGNOSIS — F341 Dysthymic disorder: Secondary | ICD-10-CM | POA: Diagnosis not present

## 2017-06-22 ENCOUNTER — Encounter: Payer: Self-pay | Admitting: Podiatry

## 2017-06-22 ENCOUNTER — Ambulatory Visit (INDEPENDENT_AMBULATORY_CARE_PROVIDER_SITE_OTHER): Payer: Medicare Other | Admitting: Podiatry

## 2017-06-22 DIAGNOSIS — M79676 Pain in unspecified toe(s): Secondary | ICD-10-CM | POA: Diagnosis not present

## 2017-06-22 DIAGNOSIS — B351 Tinea unguium: Secondary | ICD-10-CM

## 2017-06-24 NOTE — Progress Notes (Signed)
Subjective: 57 y.o. returns the office today for painful, elongated, thickened toenails which she cannot trim herself. Denies any redness or drainage around the nails. Denies any acute changes since last appointment and no new complaints today. Denies any systemic complaints such as fevers, chills, nausea, vomiting.   Objective: NAD, presents with caregiver DP/PT pulses palpable, CRT less than 3 seconds Nails hypertrophic, dystrophic, elongated, brittle, discolored 10. There is tenderness overlying the nails 1-5 bilaterally. There is no surrounding erythema or drainage along the nail sites. No interdigital maceration present. No skin breakdown, drainage, redness or signs of infection.  No open lesions or pre-ulcerative lesions are identified. Significant HAV b/l No other areas of tenderness bilateral lower extremities. No overlying edema, erythema, increased warmth. No pain with calf compression, swelling, warmth, erythema.  Assessment: Patient presents with symptomatic onychomycosis; interdigital maceration  Plan: -Treatment options including alternatives, risks, complications were discussed -Nails sharply debrided 10 without complication/bleeding. -Discussed daily foot inspection.  -Follow-up in 3 months or sooner if any problems are to arise. In the meantime, encouraged to call the office with any questions, concerns, changes symptoms.  Celesta Gentile, DPM

## 2017-07-25 DIAGNOSIS — F410 Panic disorder [episodic paroxysmal anxiety] without agoraphobia: Secondary | ICD-10-CM | POA: Diagnosis not present

## 2017-07-25 DIAGNOSIS — F341 Dysthymic disorder: Secondary | ICD-10-CM | POA: Diagnosis not present

## 2017-08-22 DIAGNOSIS — F341 Dysthymic disorder: Secondary | ICD-10-CM | POA: Diagnosis not present

## 2017-08-22 DIAGNOSIS — F410 Panic disorder [episodic paroxysmal anxiety] without agoraphobia: Secondary | ICD-10-CM | POA: Diagnosis not present

## 2017-09-12 DIAGNOSIS — E781 Pure hyperglyceridemia: Secondary | ICD-10-CM | POA: Diagnosis not present

## 2017-09-12 DIAGNOSIS — G809 Cerebral palsy, unspecified: Secondary | ICD-10-CM | POA: Diagnosis not present

## 2017-09-12 DIAGNOSIS — Z0000 Encounter for general adult medical examination without abnormal findings: Secondary | ICD-10-CM | POA: Diagnosis not present

## 2017-09-12 DIAGNOSIS — F411 Generalized anxiety disorder: Secondary | ICD-10-CM | POA: Diagnosis not present

## 2017-09-12 DIAGNOSIS — R32 Unspecified urinary incontinence: Secondary | ICD-10-CM | POA: Diagnosis not present

## 2017-09-17 DIAGNOSIS — L509 Urticaria, unspecified: Secondary | ICD-10-CM | POA: Diagnosis not present

## 2017-09-26 ENCOUNTER — Ambulatory Visit (INDEPENDENT_AMBULATORY_CARE_PROVIDER_SITE_OTHER): Payer: Medicare Other | Admitting: Podiatry

## 2017-09-26 ENCOUNTER — Encounter: Payer: Self-pay | Admitting: Podiatry

## 2017-09-26 DIAGNOSIS — M722 Plantar fascial fibromatosis: Secondary | ICD-10-CM | POA: Diagnosis not present

## 2017-09-26 DIAGNOSIS — M79676 Pain in unspecified toe(s): Secondary | ICD-10-CM

## 2017-09-26 DIAGNOSIS — B351 Tinea unguium: Secondary | ICD-10-CM | POA: Diagnosis not present

## 2017-09-28 DIAGNOSIS — R21 Rash and other nonspecific skin eruption: Secondary | ICD-10-CM | POA: Diagnosis not present

## 2017-10-01 NOTE — Progress Notes (Signed)
Subjective: 57 y.o. returns the office today for painful, elongated, thickened toenails which she cannot trim herself. Denies any redness or drainage around the nails.  She also states that she has been having some pain to the bottom of her right heel.  Her caregiver states that it did not happen the last month.  She will occasionally describe some pain in the bottom of her heel but denies any recent injury or falls or denies any swelling or redness.  No treatment.  Denies any acute changes since last appointment and no new complaints today. Denies any systemic complaints such as fevers, chills, nausea, vomiting.   Objective: NAD, presents with caregiver DP/PT pulses palpable, CRT less than 3 seconds Nails hypertrophic, dystrophic, elongated, brittle, discolored 10. There is tenderness overlying the nails 1-5 bilaterally. There is no surrounding erythema or drainage along the nail sites. No interdigital maceration present. No skin breakdown, drainage, redness or signs of infection.  At this time there is no area of tenderness to palpation of bilateral feet specifically there is no tenderness palpation on the plantar aspect of the right heel on the insertion of plantar fascia but subjectively she states this is where she had discomfort.  There is no pain with lateral compression of the calcaneus.  Plantar fascia, Achilles tendon appears to be intact. No open lesions or pre-ulcerative lesions are identified. Significant HAV b/l No other areas of tenderness bilateral lower extremities. No overlying edema, erythema, increased warmth. No pain with calf compression, swelling, warmth, erythema.  Assessment: Patient presents with symptomatic onychomycosis; right plantar fasciitis   Plan: -Treatment options including alternatives, risks, complications were discussed -Nails sharply debrided 10 without complication/bleeding. -In regards to the right foot pain discussed stretching exercises daily.  If it  starts to become more symptomatic we can do Voltaren gel however we will hold off on that she has not had anything for the last month.  Also discussed supportive shoes. -Discussed daily foot inspection.  -Follow-up in 3 months or sooner if any problems are to arise. In the meantime, encouraged to call the office with any questions, concerns, changes symptoms.  Celesta Gentile, DPM

## 2017-10-29 IMAGING — MG DIGITAL SCREENING BILATERAL MAMMOGRAM WITH CAD
4 series · 4 of 4 positions shown · non-contrast
Comparison: Previous exam(s).

CLINICAL DATA: Screening.

EXAM:
DIGITAL SCREENING BILATERAL MAMMOGRAM WITH CAD

[R MLO]
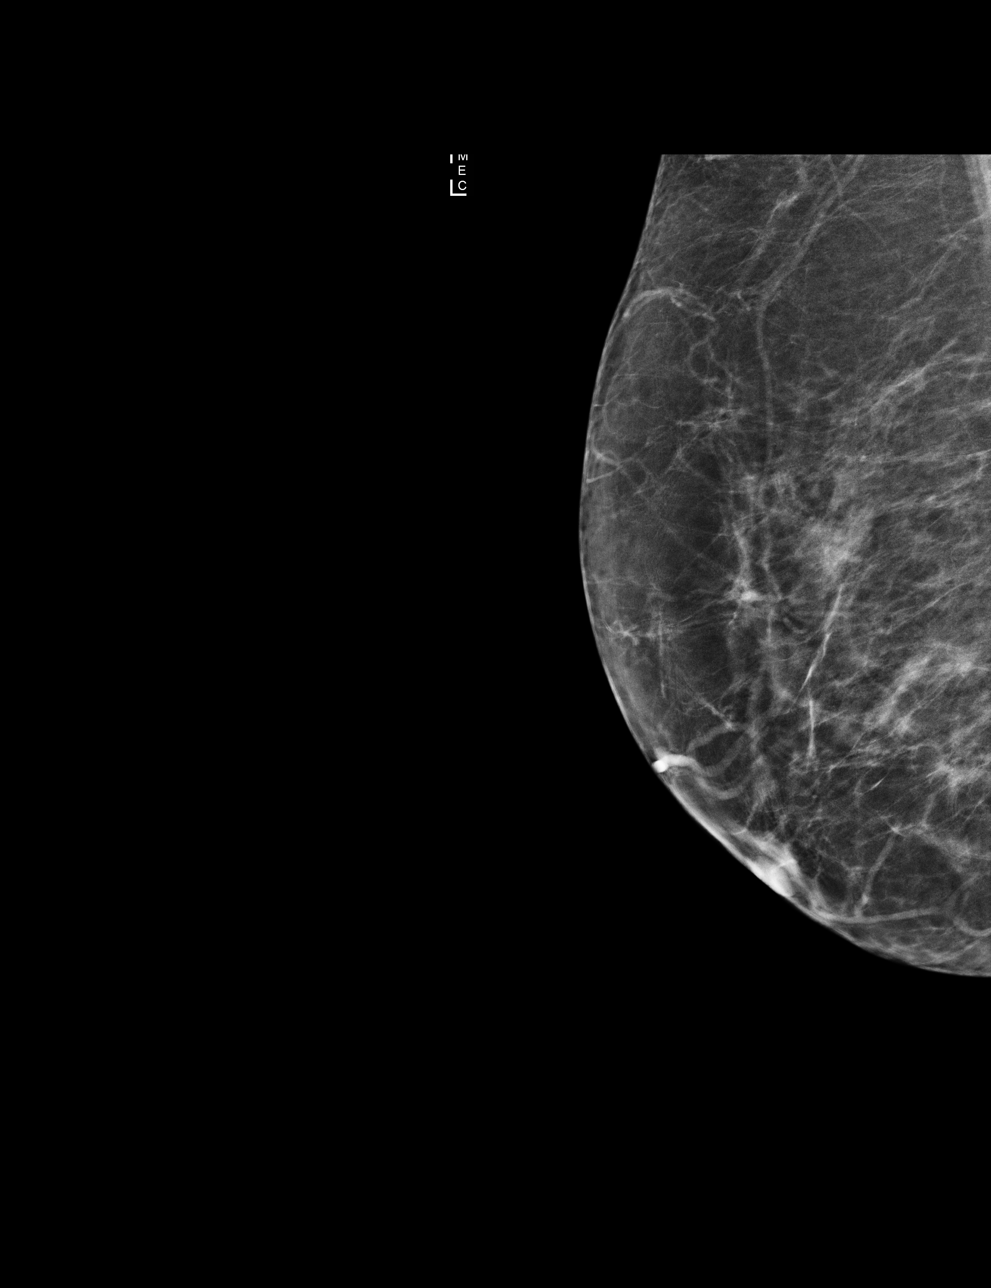

[L CC]
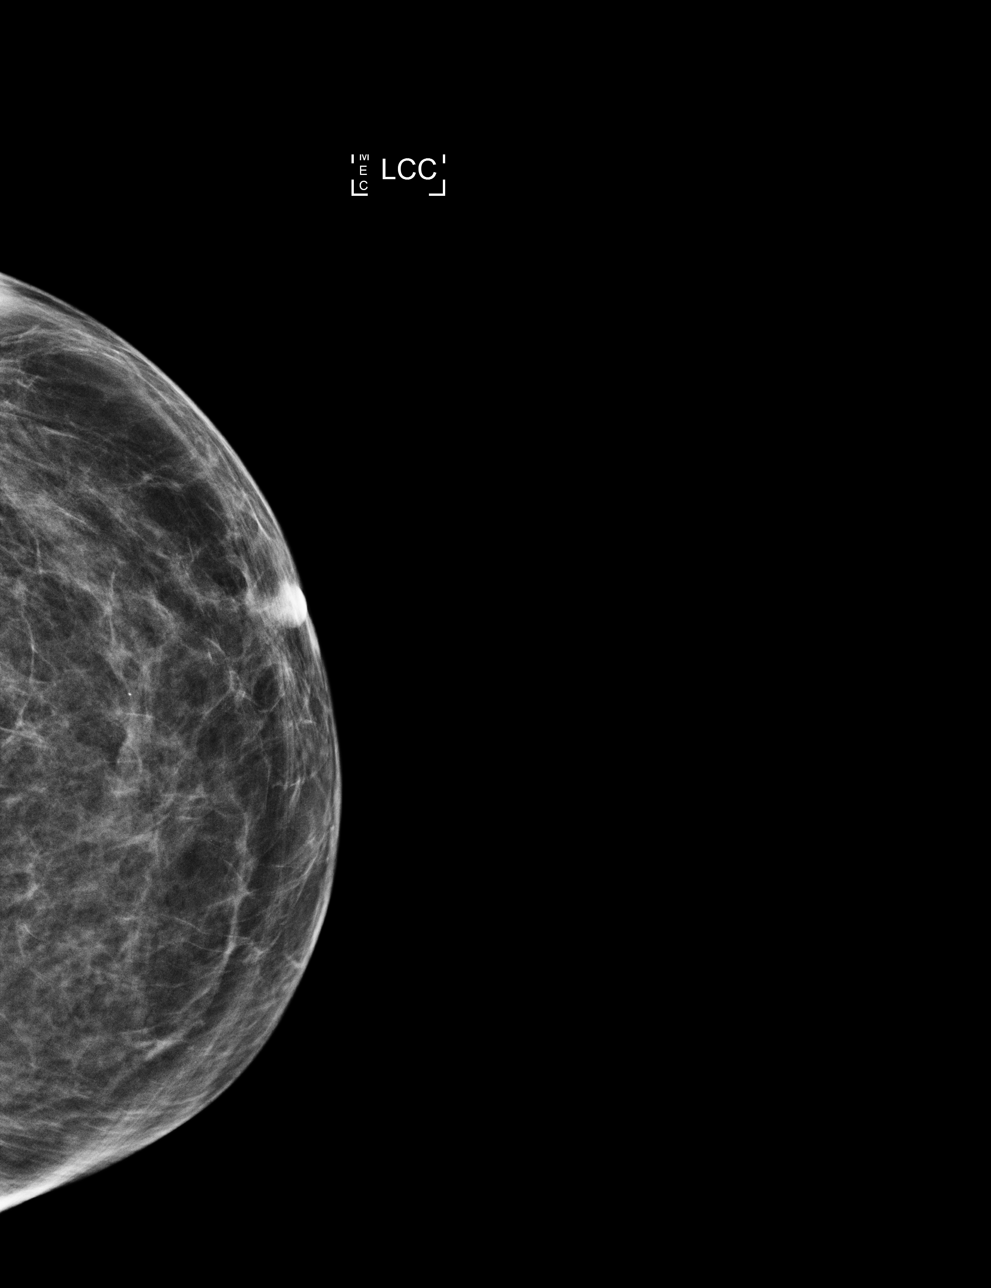

[R CC]
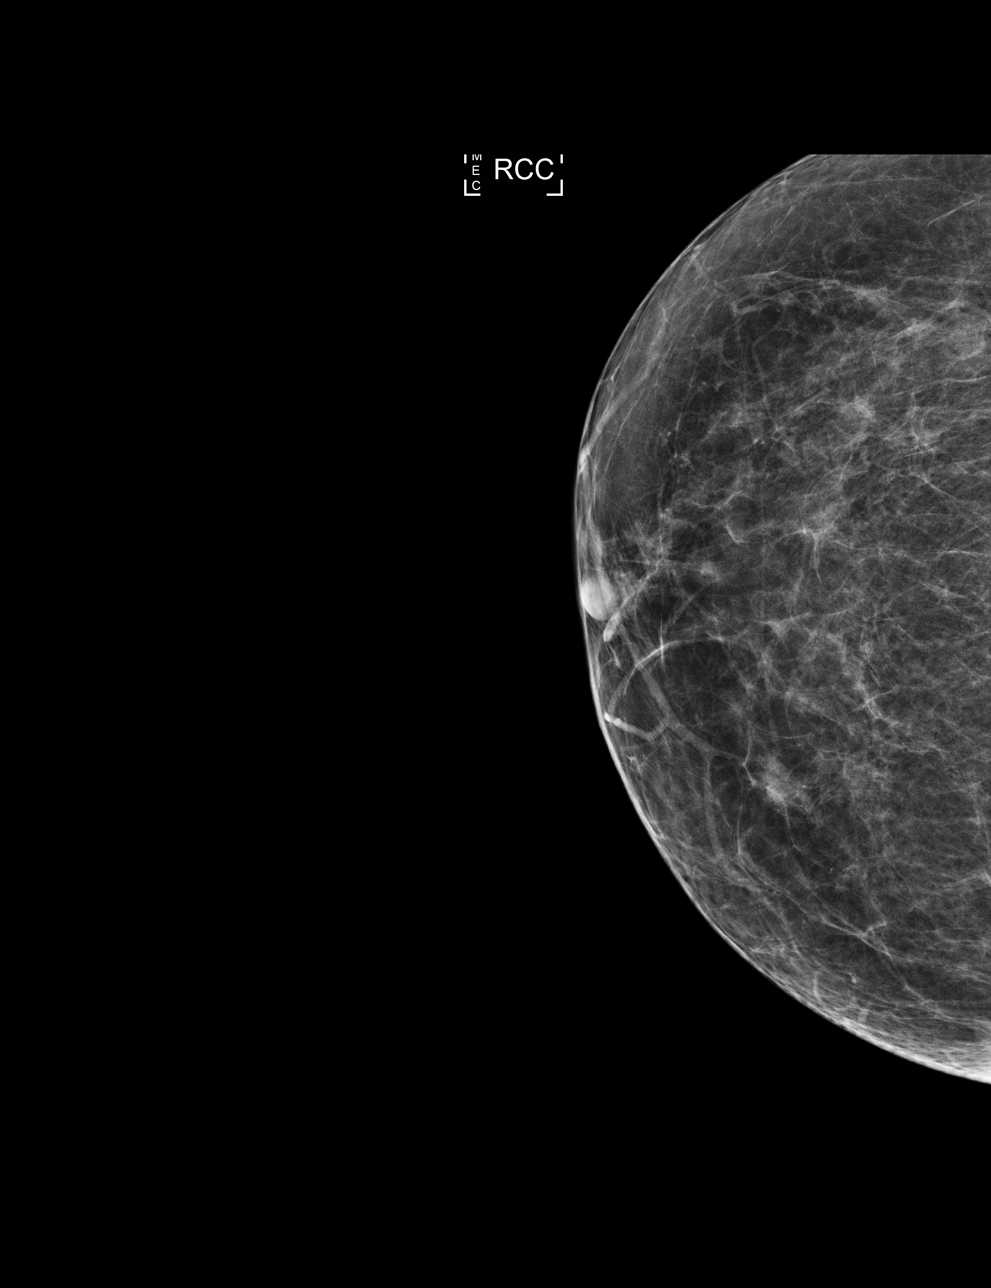

[L MLO]
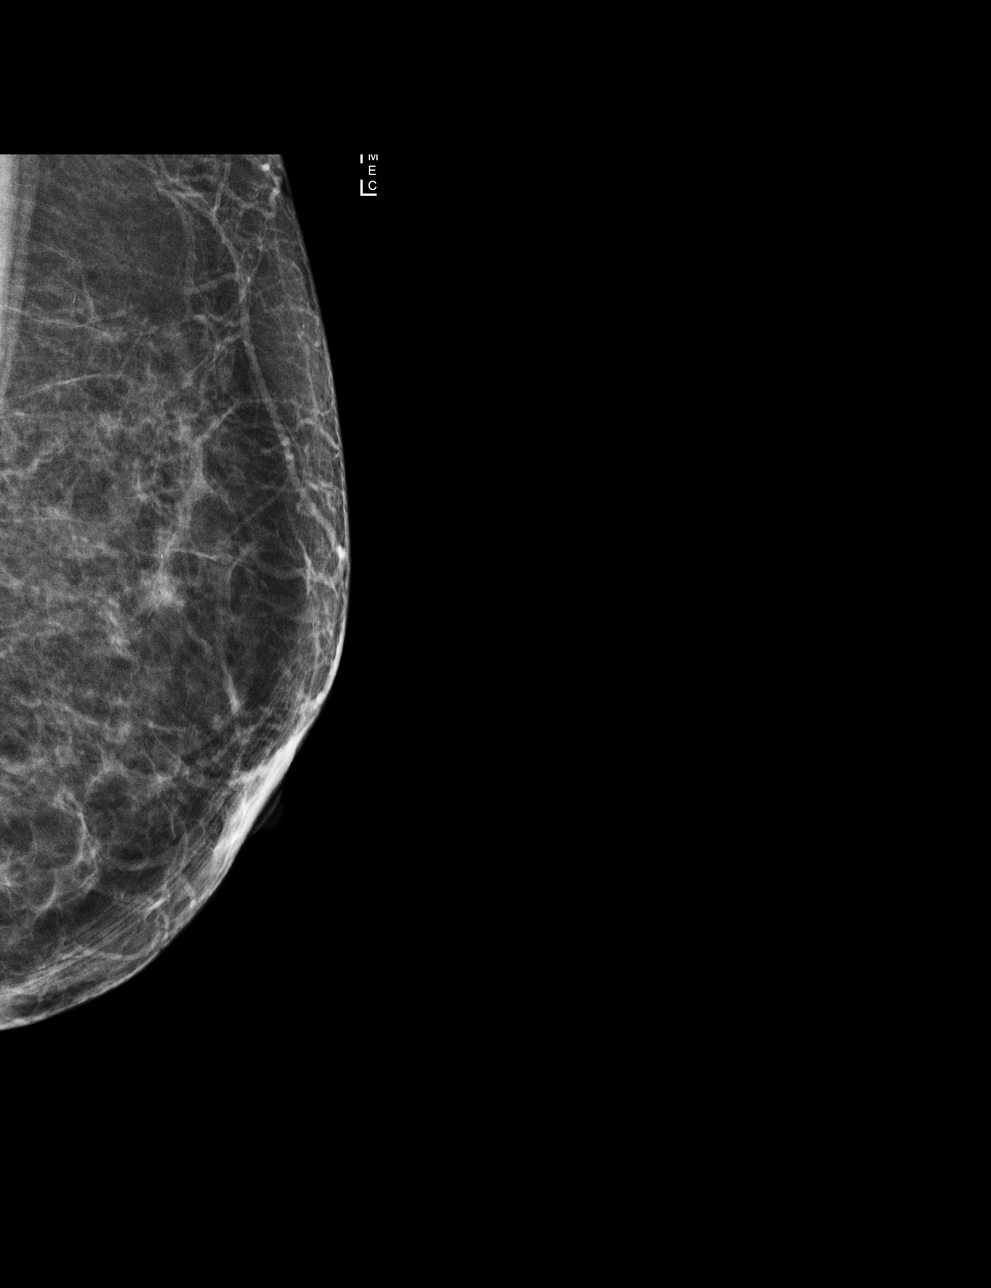

[4 of 4 positions shown; findings below may reference images not displayed]

ACR Breast Density Category b: There are scattered areas of
fibroglandular density.
FINDINGS: There are no findings suspicious for malignancy. Images were
processed with CAD.
IMPRESSION: No mammographic evidence of malignancy. A result letter of this
screening mammogram will be mailed directly to the patient.

RECOMMENDATION:
Screening mammogram in one year. (Code:AS-G-LCT)

BI-RADS CATEGORY  1: Negative.

## 2017-11-07 DIAGNOSIS — Z780 Asymptomatic menopausal state: Secondary | ICD-10-CM | POA: Diagnosis not present

## 2017-11-07 DIAGNOSIS — M810 Age-related osteoporosis without current pathological fracture: Secondary | ICD-10-CM | POA: Diagnosis not present

## 2017-11-08 ENCOUNTER — Other Ambulatory Visit: Payer: Self-pay | Admitting: Family Medicine

## 2017-11-08 DIAGNOSIS — Z1231 Encounter for screening mammogram for malignant neoplasm of breast: Secondary | ICD-10-CM

## 2017-11-15 DIAGNOSIS — F341 Dysthymic disorder: Secondary | ICD-10-CM | POA: Diagnosis not present

## 2017-11-15 DIAGNOSIS — F410 Panic disorder [episodic paroxysmal anxiety] without agoraphobia: Secondary | ICD-10-CM | POA: Diagnosis not present

## 2017-11-29 ENCOUNTER — Ambulatory Visit
Admission: RE | Admit: 2017-11-29 | Discharge: 2017-11-29 | Disposition: A | Discharge status: Home / Self Care | Payer: Medicare Other | Source: Ambulatory Visit | Attending: Family Medicine | Admitting: Family Medicine

## 2017-11-29 DIAGNOSIS — M810 Age-related osteoporosis without current pathological fracture: Secondary | ICD-10-CM | POA: Diagnosis not present

## 2017-11-29 DIAGNOSIS — Z1231 Encounter for screening mammogram for malignant neoplasm of breast: Secondary | ICD-10-CM

## 2017-12-28 ENCOUNTER — Ambulatory Visit: Payer: Medicare Other | Admitting: Podiatry

## 2018-01-02 ENCOUNTER — Ambulatory Visit: Payer: Medicare Other | Admitting: Podiatry

## 2018-01-12 ENCOUNTER — Ambulatory Visit: Payer: Medicare Other | Admitting: Podiatry

## 2018-02-16 ENCOUNTER — Ambulatory Visit (INDEPENDENT_AMBULATORY_CARE_PROVIDER_SITE_OTHER): Payer: Medicare Other

## 2018-02-16 ENCOUNTER — Ambulatory Visit (INDEPENDENT_AMBULATORY_CARE_PROVIDER_SITE_OTHER): Payer: Medicare Other | Admitting: Podiatry

## 2018-02-16 ENCOUNTER — Encounter: Payer: Self-pay | Admitting: Podiatry

## 2018-02-16 DIAGNOSIS — M79675 Pain in left toe(s): Secondary | ICD-10-CM | POA: Diagnosis not present

## 2018-02-16 DIAGNOSIS — M79674 Pain in right toe(s): Secondary | ICD-10-CM | POA: Diagnosis not present

## 2018-02-16 DIAGNOSIS — L03119 Cellulitis of unspecified part of limb: Secondary | ICD-10-CM | POA: Diagnosis not present

## 2018-02-16 DIAGNOSIS — L02612 Cutaneous abscess of left foot: Secondary | ICD-10-CM

## 2018-02-16 DIAGNOSIS — M67472 Ganglion, left ankle and foot: Secondary | ICD-10-CM | POA: Diagnosis not present

## 2018-02-16 DIAGNOSIS — B351 Tinea unguium: Secondary | ICD-10-CM | POA: Diagnosis not present

## 2018-02-16 DIAGNOSIS — L02619 Cutaneous abscess of unspecified foot: Secondary | ICD-10-CM

## 2018-02-16 MED ORDER — DOXYCYCLINE HYCLATE 100 MG PO CAPS: 100.0000 mg | ORAL_CAPSULE | Freq: Two times a day (BID) | ORAL | 0 refills | Status: DC

## 2018-02-16 NOTE — Patient Instructions (Addendum)

## 2018-02-23 ENCOUNTER — Ambulatory Visit (INDEPENDENT_AMBULATORY_CARE_PROVIDER_SITE_OTHER): Payer: Medicare Other | Admitting: Podiatry

## 2018-02-23 DIAGNOSIS — L02619 Cutaneous abscess of unspecified foot: Secondary | ICD-10-CM | POA: Diagnosis not present

## 2018-02-23 DIAGNOSIS — L03119 Cellulitis of unspecified part of limb: Secondary | ICD-10-CM

## 2018-03-01 ENCOUNTER — Encounter: Payer: Self-pay | Admitting: Podiatry

## 2018-03-01 NOTE — Progress Notes (Signed)
Subjective: "It feels much better." Tamara Curry presents today for follow up cellulitis dorsal aspect of left foot. She is accompanied by her caregiver on today.Tamara Curry says her foot is not painful on today.  Objective: Vascular Examination: Capillary refill time <3 seconds x 10 digits Dorsalis pedis and posterior tibial pulses present b/l No digital hair x 10 digits Skin temperature warm to warm b/l  Dermatological Examination: Skin with normal turgor, texture and tone b/l Toenails 1-5 dystrophic with adequate length from recent debridement Area of cellulitis dorsal midfoot noted to have resolved edema, minimal erythema and no warmth.  Musculoskeletal: Muscle strength 5/5 to all LE muscle groups  Neurological: Sensation intact with 10 gram monofilament. Vibratory sensation intact.  Assessment: Cellulitis/abscess dorsal apsect of left foot, resolved after course of oral antibiotics  Plan: 1. Patient to continue soft, supportive shoe gear. Encouraged her to wear shoes with soft, stretchable uppers to accommodate her dorsal osteophyte of her left foot. 2. Patient to report any pedal injuries to medical professional immediately. 3. Follow up 3 months for her routine care appt.  4. Patient/Caregiver to call should there be a concern in the interim.

## 2018-03-05 ENCOUNTER — Encounter: Payer: Self-pay | Admitting: Podiatry

## 2018-03-05 NOTE — Progress Notes (Signed)
Subjective: Tamara Curry presents today with for foot care follow-up. She is seen for painful, discolored, thick toenails which are painful when wearing enclosed shoe gear. Pain is relieved with periodic professional debridement.  She also relates pain to the dorsum of the left foot.  She nor her caretaker are able to relate any episode of trauma to the area, she does have a palpable osteophyte secondary to arthritis at this area.  The area is tender.  Caretaker relates nothing has been done to treat the area.  Objective: Vascular Examination: Capillary refill time <3 seconds x 10 digits Dorsalis pedis and posterior tibial pulses present b/l No digital hair x 10 digits Skin temperature warm to warm b/l  Dermatological Examination: Skin with normal turgor, texture and tone Toenails 1 through 5 bilaterally are elongated, thickened, dystrophic with subungual debris.  Left foot area was noted to be erythematous, tender to palpation, and warm.  This is overlying an area of degenerative joint disease that she has.  Following a Betadine prep and local block consisting of 50-50 mix of 1% lidocaine plain and 0.5% Marcaine plain, no purulence nor other drainage was aspirated from this area.  Musculoskeletal: Muscle strength 5/5 to all LE muscle groups DJD left midfoot with known palpable exostosis and ganglion   Neurological: Sensation intact with 10 gram monofilament. Vibratory sensation intact.  X-ray left foot  she does have DJD of the left midfoot with exostosis over area. No bone destruction appreciated.  Assessment: 1. Painful mycotic toenails 1 through 5 bilaterally 2. Ganglion left dorsal midfoot 2.  Cellulitis left dorsal midfoot  Plan: 1. Evaluated cellulitic left foot with Dr. Jacqualyn Posey today. Ganglion area prepped with betadine and aspirated with no abscess found. Tamara Curry was placed on doxycycline 100 mg twice daily for 10 days.  Tamara Curry and caregiver were advised not to  tie her shoes tight over the area. 2. Toenails 1 through 5 bilaterally were debrided in length and girth without complication. 3. Patient to continue soft, supportive shoe gear 4. Patient to report any pedal injuries to medical professional immediately. 5. Follow up 1 week.. Patient/POA to call should there be a concern in the interim.

## 2018-03-23 DIAGNOSIS — Z23 Encounter for immunization: Secondary | ICD-10-CM | POA: Diagnosis not present

## 2018-05-04 DIAGNOSIS — G809 Cerebral palsy, unspecified: Secondary | ICD-10-CM | POA: Diagnosis not present

## 2018-05-04 DIAGNOSIS — H6690 Otitis media, unspecified, unspecified ear: Secondary | ICD-10-CM | POA: Diagnosis not present

## 2018-05-04 DIAGNOSIS — H6692 Otitis media, unspecified, left ear: Secondary | ICD-10-CM | POA: Diagnosis not present

## 2018-05-18 ENCOUNTER — Ambulatory Visit: Payer: Medicare Other | Admitting: Podiatry

## 2018-06-06 ENCOUNTER — Ambulatory Visit (INDEPENDENT_AMBULATORY_CARE_PROVIDER_SITE_OTHER): Payer: Medicare Other | Admitting: Podiatry

## 2018-06-06 DIAGNOSIS — B351 Tinea unguium: Secondary | ICD-10-CM | POA: Diagnosis not present

## 2018-06-06 DIAGNOSIS — M79674 Pain in right toe(s): Secondary | ICD-10-CM

## 2018-06-06 DIAGNOSIS — M79675 Pain in left toe(s): Secondary | ICD-10-CM

## 2018-06-06 DIAGNOSIS — B353 Tinea pedis: Secondary | ICD-10-CM

## 2018-06-06 MED ORDER — NYSTATIN 100000 UNIT/GM EX POWD: CUTANEOUS | 1 refills | Status: DC

## 2018-06-06 NOTE — Patient Instructions (Addendum)
Attention staff:  Dry well between toes  after bath or shower    Onychomycosis/Fungal Toenails  WHAT IS IT? An infection that lies within the keratin of your nail plate that is caused by a fungus.  WHY ME? Fungal infections affect all ages, sexes, races, and creeds.  There may be many factors that predispose you to a fungal infection such as age, coexisting medical conditions such as diabetes, or an autoimmune disease; stress, medications, fatigue, genetics, etc.  Bottom line: fungus thrives in a warm, moist environment and your shoes offer such a location.  IS IT CONTAGIOUS? Theoretically, yes.  You do not want to share shoes, nail clippers or files with someone who has fungal toenails.  Walking around barefoot in the same room or sleeping in the same bed is unlikely to transfer the organism.  It is important to realize, however, that fungus can spread easily from one nail to the next on the same foot.  HOW DO WE TREAT THIS?  There are several ways to treat this condition.  Treatment may depend on many factors such as age, medications, pregnancy, liver and kidney conditions, etc.  It is best to ask your doctor which options are available to you.  1. No treatment.   Unlike many other medical concerns, you can live with this condition.  However for many people this can be a painful condition and may lead to ingrown toenails or a bacterial infection.  It is recommended that you keep the nails cut short to help reduce the amount of fungal nail. 2. Topical treatment.  These range from herbal remedies to prescription strength nail lacquers.  About 40-50% effective, topicals require twice daily application for approximately 9 to 12 months or until an entirely new nail has grown out.  The most effective topicals are medical grade medications available through physicians offices. 3. Oral antifungal medications.  With an 80-90% cure rate, the most common oral medication requires 3 to 4 months of therapy and  stays in your system for a year as the new nail grows out.  Oral antifungal medications do require blood work to make sure it is a safe drug for you.  A liver function panel will be performed prior to starting the medication and after the first month of treatment.  It is important to have the blood work performed to avoid any harmful side effects.  In general, this medication safe but blood work is required. 4. Laser Therapy.  This treatment is performed by applying a specialized laser to the affected nail plate.  This therapy is noninvasive, fast, and non-painful.  It is not covered by insurance and is therefore, out of pocket.  The results have been very good with a 80-95% cure rate.  [facility] is the only practice in the area to offer this therapy. 5. Permanent Nail Avulsion.  Removing the entire nail so that a new nail will not grow back.

## 2018-06-07 DIAGNOSIS — M810 Age-related osteoporosis without current pathological fracture: Secondary | ICD-10-CM | POA: Diagnosis not present

## 2018-06-07 DIAGNOSIS — Z23 Encounter for immunization: Secondary | ICD-10-CM | POA: Diagnosis not present

## 2018-06-14 ENCOUNTER — Encounter: Payer: Self-pay | Admitting: Podiatry

## 2018-06-14 NOTE — Progress Notes (Signed)
Subjective: Tamara Curry presents today with painful, thick toenails 1-5 b/l that she cannot cut and which interfere with daily activities.  Pain is aggravated when wearing enclosed shoe gear and relieved with periodic professional debridement.  Mayra Neer, MD is her PCP.   Current Outpatient Medications:  .  Calcium Carb-Cholecalciferol (CALCIUM 600 + D PO), Take by mouth 2 (two) times daily., Disp: , Rfl:  .  clonazePAM (KLONOPIN) 0.5 MG tablet, Take 0.5 mg by mouth 2 (two) times daily as needed for anxiety., Disp: , Rfl:  .  clotrimazole-betamethasone (LOTRISONE) cream, Apply 1 application topically 2 (two) times daily., Disp: 45 g, Rfl: 0 .  diphenhydrAMINE (SOMINEX) 25 MG tablet, Take 50 mg by mouth at bedtime. , Disp: , Rfl:  .  doxycycline (VIBRA-TABS) 100 MG tablet, Take 1 tablet (100 mg total) by mouth 2 (two) times daily., Disp: 20 tablet, Rfl: 0 .  doxycycline (VIBRAMYCIN) 100 MG capsule, Take 1 capsule (100 mg total) by mouth 2 (two) times daily., Disp: 20 capsule, Rfl: 0 .  ibuprofen (ADVIL,MOTRIN) 600 MG tablet, Take 600 mg by mouth every 6 (six) hours as needed for headache or moderate pain. , Disp: , Rfl:  .  Multiple Vitamin (MULTIVITAMIN WITH MINERALS) TABS tablet, Take 1 tablet by mouth daily., Disp: , Rfl:  .  nystatin (MYCOSTATIN/NYSTOP) powder, Apply between toes once daily., Disp: 15 g, Rfl: 1 .  Omega-3 Fatty Acids (FISH OIL PO), Take 1 tablet by mouth at bedtime. , Disp: , Rfl:  .  PARoxetine (PAXIL) 10 MG tablet, Take 20 mg by mouth daily. , Disp: , Rfl:  .  venlafaxine XR (EFFEXOR-XR) 75 MG 24 hr capsule, Take 75 mg by mouth daily with breakfast., Disp: , Rfl:   Allergies  Allergen Reactions  . Penicillins Other (See Comments)    Hot feeling in mouth    Objective:  Vascular Examination: Capillary refill time <3 seconds x 10 digits  Dorsalis pedis and Posterior tibial pulses palpable b/l  Digital hair absent x 10 digits  Skin temperature gradient WNL  b/l  Dermatological Examination: Skin with normal turgor, texture and tone b/l  Toenails 1-5 b/l discolored, thick, dystrophic with subungual debris and pain with palpation to nailbeds due to thickness of nails.  Interdigital maceration noted 3rd and 4th webspace b/l. No breaks in skin, no edema, no erythema, no drainage.  Musculoskeletal: Muscle strength 5/5 to all LE muscle groups  DJD left midfoot with palpable exostosis  No gross bony deformities b/l.  No pain, crepitus or joint limitation noted with ROM.   Neurological: Sensation intact with 10 gram monofilament.  Vibratory sensation intact.  Assessment: Painful onychomycosis toenails 1-5 b/l  Tinea pedis interdigitally webspaces 3, 4 b/l  Plan: 1. Toenails 1-5 b/l were debrided in length and girth without iatrogenic bleeding. 2. Patient to continue soft, supportive shoe gear. 3. Discussed tinea pedis with patient and caregiver. Rx sent to pharmacy for Nystatin Powder to be applied between toes once daily. 4. Patient to report any pedal injuries to medical professional immediately. 5. Follow up 3 months.  6. Patient/POA to call should there be a concern in the interim.

## 2018-09-21 ENCOUNTER — Other Ambulatory Visit: Payer: Self-pay

## 2018-09-21 ENCOUNTER — Ambulatory Visit (INDEPENDENT_AMBULATORY_CARE_PROVIDER_SITE_OTHER): Payer: Medicare Other | Admitting: Podiatry

## 2018-09-21 VITALS — Temp 98.1°F

## 2018-09-21 DIAGNOSIS — B351 Tinea unguium: Secondary | ICD-10-CM | POA: Diagnosis not present

## 2018-09-21 DIAGNOSIS — M79675 Pain in left toe(s): Secondary | ICD-10-CM

## 2018-09-21 DIAGNOSIS — M79674 Pain in right toe(s): Secondary | ICD-10-CM

## 2018-09-21 NOTE — Progress Notes (Signed)
Subjective: 58 year old female presents the office today with a caregiver for concerns of pain mostly to her left second and third toenails.  She states that her nails have become long and she did try to take them off herself and are causing pain.  Denies any redness or drainage or any swelling. Denies any systemic complaints such as fevers, chills, nausea, vomiting. No acute changes since last appointment, and no other complaints at this time.   Objective: NAD-presents with caregiver however she left the room during treatment. DP/PT pulses palpable bilaterally, CRT less than 3 seconds Significant bunion deformities present bilaterally with hammertoe contractures in the second toes are overlapping hallux. In general her nails are hypertrophic, dystrophic with yellow-brown discoloration particularly left second third toenails are long.  There is no edema, erythema, drainage or pus or any clinical signs of infection noted to the area today.  Tenderness nails 1-5 bilaterally but again the left second and third toenails are more significant nails on the left side are loose from the underlying nail bed and only attached proximally. No open lesions or pre-ulcerative lesions.  No pain with calf compression, swelling, warmth, erythema  Assessment: Symptomatic onychomycosis  Plan: -All treatment options discussed with the patient including all alternatives, risks, complications.  -Debrided nails x10 without any complications or bleeding.  After debridement of pain resolved the left second third toe.  Monitoring signs or symptoms of infection.  Discussed the nails may, as they are loose on the left side but there is no signs of infection we will continue to monitor. -Patient encouraged to call the office with any questions, concerns, change in symptoms.

## 2018-11-21 DIAGNOSIS — F411 Generalized anxiety disorder: Secondary | ICD-10-CM | POA: Diagnosis not present

## 2018-11-21 DIAGNOSIS — G809 Cerebral palsy, unspecified: Secondary | ICD-10-CM | POA: Diagnosis not present

## 2018-11-21 DIAGNOSIS — R32 Unspecified urinary incontinence: Secondary | ICD-10-CM | POA: Diagnosis not present

## 2018-11-21 DIAGNOSIS — Z0000 Encounter for general adult medical examination without abnormal findings: Secondary | ICD-10-CM | POA: Diagnosis not present

## 2018-11-21 DIAGNOSIS — H9190 Unspecified hearing loss, unspecified ear: Secondary | ICD-10-CM | POA: Diagnosis not present

## 2018-11-21 DIAGNOSIS — M810 Age-related osteoporosis without current pathological fracture: Secondary | ICD-10-CM | POA: Diagnosis not present

## 2018-11-21 DIAGNOSIS — E781 Pure hyperglyceridemia: Secondary | ICD-10-CM | POA: Diagnosis not present

## 2018-11-23 ENCOUNTER — Ambulatory Visit (INDEPENDENT_AMBULATORY_CARE_PROVIDER_SITE_OTHER): Payer: Medicare Other | Admitting: Podiatry

## 2018-11-23 ENCOUNTER — Other Ambulatory Visit: Payer: Self-pay

## 2018-11-23 ENCOUNTER — Encounter: Payer: Self-pay | Admitting: Podiatry

## 2018-11-23 ENCOUNTER — Ambulatory Visit: Payer: Medicare Other | Admitting: Podiatry

## 2018-11-23 DIAGNOSIS — M79674 Pain in right toe(s): Secondary | ICD-10-CM | POA: Diagnosis not present

## 2018-11-23 DIAGNOSIS — B353 Tinea pedis: Secondary | ICD-10-CM | POA: Diagnosis not present

## 2018-11-23 DIAGNOSIS — M79675 Pain in left toe(s): Secondary | ICD-10-CM

## 2018-11-23 DIAGNOSIS — B351 Tinea unguium: Secondary | ICD-10-CM | POA: Diagnosis not present

## 2018-11-23 MED ORDER — NYSTATIN 100000 UNIT/GM EX POWD: CUTANEOUS | 1 refills | Status: DC

## 2018-11-23 NOTE — Patient Instructions (Addendum)
Athlete's Foot  Athlete's foot (tinea pedis) is a fungal infection of the skin on your feet. It often occurs on the skin that is between or underneath the toes. It can also occur on the soles of your feet. The infection can spread from person to person (is contagious). It can also spread when a person's bare feet come in contact with the fungus on shower floors or on items such as shoes. What are the causes? This condition is caused by a fungus that grows in warm, moist places. You can get athlete's foot by sharing shoes, shower stalls, towels, and wet floors with someone who is infected. Not washing your feet or changing your socks often enough can also lead to athlete's foot. What increases the risk? This condition is more likely to develop in:  Men.  People who have a weak body defense system (immune system).  People who have diabetes.  People who use public showers, such as at a gym.  People who wear heavy-duty shoes, such as industrial or military shoes.  Seasons with warm, humid weather. What are the signs or symptoms? Symptoms of this condition include:  Itchy areas between your toes or on the soles of your feet.  White, flaky, or scaly areas between your toes or on the soles of your feet.  Very itchy small blisters between your toes or on the soles of your feet.  Small cuts in your skin. These cuts can become infected.  Thick or discolored toenails. How is this diagnosed? This condition may be diagnosed with a physical exam and a review of your medical history. Your health care provider may also take a skin or toenail sample to examine under a microscope. How is this treated? This condition is treated with antifungal medicines. These may be applied as powders, ointments, or creams. In severe cases, an oral antifungal medicine may be given. Follow these instructions at home: Medicines  Apply or take over-the-counter and prescription medicines only as told by your health  care provider.  Apply your antifungal medicine as told by your health care provider. Do not stop using the antifungal even if your condition improves. Foot care  Do not scratch your feet.  Keep your feet dry: ? Wear cotton or wool socks. Change your socks every day or if they become wet. ? Wear shoes that allow air to flow, such as sandals or canvas tennis shoes.  Wash and dry your feet, including the area between your toes. Also, wash and dry your feet: ? Every day or as told by your health care provider. ? After exercising. General instructions  Do not let others use towels, shoes, nail clippers, or other personal items that touch your feet.  Protect your feet by wearing sandals in wet areas, such as locker rooms and shared showers.  Keep all follow-up visits as told by your health care provider. This is important.  If you have diabetes, keep your blood sugar under control. Contact a health care provider if:  You have a fever.  You have swelling, soreness, warmth, or redness in your foot.  Your feet are not getting better with treatment.  Your symptoms get worse.  You have new symptoms. Summary  Athlete's foot (tinea pedis) is a fungal infection of the skin on your feet. It often occurs on skin that is between or underneath the toes.  This condition is caused by a fungus that grows in warm, moist places.  Symptoms include white, flaky, or scaly areas between   your toes or on the soles of your feet.  This condition is treated with antifungal medicines.  Keep your feet clean. Always dry them thoroughly. This information is not intended to replace advice given to you by your health care provider. Make sure you discuss any questions you have with your health care provider. Document Released: 04/01/2000 Document Revised: 03/30/2017 Document Reviewed: 01/23/2017 Elsevier Patient Education  2020 [employer]   Onychomycosis/Fungal Toenails  WHAT IS IT? An infection  that lies within the keratin of your nail plate that is caused by a fungus.  WHY ME? Fungal infections affect all ages, sexes, races, and creeds.  There may be many factors that predispose you to a fungal infection such as age, coexisting medical conditions such as diabetes, or an autoimmune disease; stress, medications, fatigue, genetics, etc.  Bottom line: fungus thrives in a warm, moist environment and your shoes offer such a location.  IS IT CONTAGIOUS? Theoretically, yes.  You do not want to share shoes, nail clippers or files with someone who has fungal toenails.  Walking around barefoot in the same room or sleeping in the same bed is unlikely to transfer the organism.  It is important to realize, however, that fungus can spread easily from one nail to the next on the same foot.  HOW DO WE TREAT THIS?  There are several ways to treat this condition.  Treatment may depend on many factors such as age, medications, pregnancy, liver and kidney conditions, etc.  It is best to ask your doctor which options are available to you.  1. No treatment.   Unlike many other medical concerns, you can live with this condition.  However for many people this can be a painful condition and may lead to ingrown toenails or a bacterial infection.  It is recommended that you keep the nails cut short to help reduce the amount of fungal nail. 2. Topical treatment.  These range from herbal remedies to prescription strength nail lacquers.  About 40-50% effective, topicals require twice daily application for approximately 9 to 12 months or until an entirely new nail has grown out.  The most effective topicals are medical grade medications available through physicians offices. 3. Oral antifungal medications.  With an 80-90% cure rate, the most common oral medication requires 3 to 4 months of therapy and stays in your system for a year as the new nail grows out.  Oral antifungal medications do require blood work to make sure it is  a safe drug for you.  A liver function panel will be performed prior to starting the medication and after the first month of treatment.  It is important to have the blood work performed to avoid any harmful side effects.  In general, this medication safe but blood work is required. 4. Laser Therapy.  This treatment is performed by applying a specialized laser to the affected nail plate.  This therapy is noninvasive, fast, and non-painful.  It is not covered by insurance and is therefore, out of pocket.  The results have been very good with a 80-95% cure rate.  [facility] is the only practice in the area to offer this therapy. 5. Permanent Nail Avulsion.  Removing the entire nail so that a new nail will not grow back. 

## 2018-11-25 NOTE — Progress Notes (Signed)
Subjective:  Tamara Curry presents to clinic today with cc of  painful, thick, discolored, elongated toenails 1-5 b/l that become tender and cannot cut because of thickness. Pain is aggravated when wearing enclosed shoe gear.  Her Social Worker is present during the visit.  Mayra Neer, MD is her PCP.    Allergies  Allergen Reactions  . Penicillins Other (See Comments)    Hot feeling in mouth     Objective: Physical Examination:  Vascular Examination: Capillary refill time <3 seconds x 10 digits.  Palpable DP/PT pulses b/l.  Digital hair present b/l.  No edema noted b/l.  Skin temperature gradient WNL b/l.  Dermatological Examination: Skin with normal turgor, texture and tone b/l.  No open wounds b/l.  No interdigital macerations noted b/l.  Elongated, thick, discolored brittle toenails with subungual debris and pain on dorsal palpation of nailbeds 1-5 b/l.  Interdigital maceration noted 1st-4th webspaces b/l. No blistering, no weeping, no open wounds.  Musculoskeletal Examination: Muscle strength 5/5 to all muscle groups b/l.  HAV with bunion b/l.  Overlapping hammertoe deformity 2nd digit b/l.  No pain, crepitus or joint discomfort with active/passive ROM.  Neurological Examination: Sensation intact 5/5 b/l with 10 gram monofilament.   Assessment: Mycotic nail infection with pain 1-5 b/l Interdigital maceration webspaces 1-4 b/l  Plan: 1. Toenails 1-5 b/l were debrided in length and girth without iatrogenic laceration. 2. Rx sent to pharmacy for Nystatin Powder to be applied between toes once daily. 3. Continue soft, supportive shoe gear daily. 4. Report any pedal injuries to medical professional. 5.  Follow up 3 months. 6.  Patient/POA to call should there be a question/concern in there interim.

## 2018-12-07 DIAGNOSIS — M810 Age-related osteoporosis without current pathological fracture: Secondary | ICD-10-CM | POA: Diagnosis not present

## 2019-02-20 ENCOUNTER — Ambulatory Visit (INDEPENDENT_AMBULATORY_CARE_PROVIDER_SITE_OTHER): Payer: Medicare Other | Admitting: Podiatry

## 2019-02-20 ENCOUNTER — Other Ambulatory Visit: Payer: Self-pay

## 2019-02-20 ENCOUNTER — Encounter: Payer: Self-pay | Admitting: Podiatry

## 2019-02-20 DIAGNOSIS — M79674 Pain in right toe(s): Secondary | ICD-10-CM | POA: Diagnosis not present

## 2019-02-20 DIAGNOSIS — L601 Onycholysis: Secondary | ICD-10-CM

## 2019-02-20 DIAGNOSIS — Q843 Anonychia: Secondary | ICD-10-CM

## 2019-02-20 DIAGNOSIS — M79675 Pain in left toe(s): Secondary | ICD-10-CM

## 2019-02-20 DIAGNOSIS — B351 Tinea unguium: Secondary | ICD-10-CM | POA: Diagnosis not present

## 2019-02-20 DIAGNOSIS — B353 Tinea pedis: Secondary | ICD-10-CM | POA: Diagnosis not present

## 2019-02-20 MED ORDER — NYSTATIN 100000 UNIT/GM EX POWD: CUTANEOUS | 2 refills | Status: DC

## 2019-02-20 MED ORDER — CICLOPIROX 0.77 % EX GEL: CUTANEOUS | 1 refills | Status: DC

## 2019-02-20 NOTE — Patient Instructions (Addendum)
To Assisted Living Facility:  Tamara Curry's moisture between her toes has not improved. I will prescribe a new topical in addition to the powder that should be placed between her toes.  Apply Gel between and under her toes at 9 am and 5 pm.  Apply Nystatin Powder between toes at [facility] and 9 pm.  Please make sure staff is drying well between toes and under toes after bath/showers/incontinence accidents.   Athlete's Foot  Athlete's foot (tinea pedis) is a fungal infection of the skin on your feet. It often occurs on the skin that is between or underneath the toes. It can also occur on the soles of your feet. The infection can spread from person to person (is contagious). It can also spread when a person's bare feet come in contact with the fungus on shower floors or on items such as shoes. What are the causes? This condition is caused by a fungus that grows in warm, moist places. You can get athlete's foot by sharing shoes, shower stalls, towels, and wet floors with someone who is infected. Not washing your feet or changing your socks often enough can also lead to athlete's foot. What increases the risk? This condition is more likely to develop in:  Men.  People who have a weak body defense system (immune system).  People who have diabetes.  People who use public showers, such as at a gym.  People who wear heavy-duty shoes, such as Environmental manager.  Seasons with warm, humid weather. What are the signs or symptoms? Symptoms of this condition include:  Itchy areas between your toes or on the soles of your feet.  White, flaky, or scaly areas between your toes or on the soles of your feet.  Very itchy small blisters between your toes or on the soles of your feet.  Small cuts in your skin. These cuts can become infected.  Thick or discolored toenails. How is this diagnosed? This condition may be diagnosed with a physical exam and a review of your medical history. Your health  care provider may also take a skin or toenail sample to examine under a microscope. How is this treated? This condition is treated with antifungal medicines. These may be applied as powders, ointments, or creams. In severe cases, an oral antifungal medicine may be given. Follow these instructions at home: Medicines  Apply or take over-the-counter and prescription medicines only as told by your health care provider.  Apply your antifungal medicine as told by your health care provider. Do not stop using the antifungal even if your condition improves. Foot care  Do not scratch your feet.  Keep your feet dry: ? Wear cotton or wool socks. Change your socks every day or if they become wet. ? Wear shoes that allow air to flow, such as sandals or canvas tennis shoes.  Wash and dry your feet, including the area between your toes. Also, wash and dry your feet: ? Every day or as told by your health care provider. ? After exercising. General instructions  Do not let others use towels, shoes, nail clippers, or other personal items that touch your feet.  Protect your feet by wearing sandals in wet areas, such as locker rooms and shared showers.  Keep all follow-up visits as told by your health care provider. This is important.  If you have diabetes, keep your blood sugar under control. Contact a health care provider if:  You have a fever.  You have swelling, soreness, warmth, or  redness in your foot.  Your feet are not getting better with treatment.  Your symptoms get worse.  You have new symptoms. Summary  Athlete's foot (tinea pedis) is a fungal infection of the skin on your feet. It often occurs on skin that is between or underneath the toes.  This condition is caused by a fungus that grows in warm, moist places.  Symptoms include white, flaky, or scaly areas between your toes or on the soles of your feet.  This condition is treated with antifungal medicines.  Keep your feet  clean. Always dry them thoroughly. This information is not intended to replace advice given to you by your health care provider. Make sure you discuss any questions you have with your health care provider. Document Released: 04/01/2000 Document Revised: 03/30/2017 Document Reviewed: 01/23/2017 Elsevier Patient Education  2020 [employer].

## 2019-02-22 ENCOUNTER — Ambulatory Visit: Payer: Medicare Other | Admitting: Podiatry

## 2019-02-23 DIAGNOSIS — Z23 Encounter for immunization: Secondary | ICD-10-CM | POA: Diagnosis not present

## 2019-02-24 NOTE — Progress Notes (Signed)
Subjective:  Tamara Curry presents to clinic today with cc of  painful, thick, discolored, elongated toenails 1-5 b/l that become tender and cannot cut because of thickness. Pain is aggravated when wearing enclosed shoe gear.   Current Outpatient Medications on File Prior to Visit  Medication Sig Dispense Refill  . Calcium Carb-Cholecalciferol (CALCIUM 600 + D PO) Take by mouth 2 (two) times daily.    . Ciprofloxacin HCl 0.2 % otic solution     . citalopram (CELEXA) 40 MG tablet     . clonazePAM (KLONOPIN) 0.5 MG tablet Take 0.5 mg by mouth 2 (two) times daily as needed for anxiety.    . clotrimazole-betamethasone (LOTRISONE) cream Apply 1 application topically 2 (two) times daily. 45 g 0  . diphenhydrAMINE (SOMINEX) 25 MG tablet Take 50 mg by mouth at bedtime.     Marland Kitchen doxycycline (VIBRA-TABS) 100 MG tablet Take 1 tablet (100 mg total) by mouth 2 (two) times daily. 20 tablet 0  . doxycycline (VIBRAMYCIN) 100 MG capsule Take 1 capsule (100 mg total) by mouth 2 (two) times daily. 20 capsule 0  . ibuprofen (ADVIL,MOTRIN) 600 MG tablet Take 600 mg by mouth every 6 (six) hours as needed for headache or moderate pain.     Marland Kitchen lamoTRIgine (LAMICTAL) 25 MG tablet     . Multiple Vitamin (MULTIVITAMIN WITH MINERALS) TABS tablet Take 1 tablet by mouth daily.    . Omega-3 Fatty Acids (FISH OIL PO) Take 1 tablet by mouth at bedtime.     Marland Kitchen PARoxetine (PAXIL) 10 MG tablet Take 20 mg by mouth daily.     Marland Kitchen PROLIA 60 MG/ML SOSY injection     . SHINGRIX injection     . trimethoprim-polymyxin b (POLYTRIM) ophthalmic solution INSTILL 2 DROPS INTO AFFECTED EYE 4 TIMES DAILY FOR 7 DAYS    . venlafaxine XR (EFFEXOR-XR) 75 MG 24 hr capsule Take 75 mg by mouth daily with breakfast.     No current facility-administered medications on file prior to visit.      Allergies  Allergen Reactions  . Penicillins Other (See Comments)    Hot feeling in mouth     Objective: There were no vitals filed for this  visit.  Physical Examination:  Vascular Examination: Capillary refill time immediate x 10 digits.  Palpable DP/PT pulses b/l.  Digital hair present b/l.  No edema noted b/l.  Skin temperature gradient WNL b/l.  Dermatological Examination: Skin with normal turgor, texture and tone b/l.  No open wounds b/l.  No interdigital macerations noted b/l.  Elongated, thick, discolored brittle toenails with subungual debris and pain on dorsal palpation of nailbeds 1-5 left, right hallux and digits 2, 3, 5 right foot.  Anonychia right 4th toe  Nailbed is completely epithelialized and intact.  There is noted onchyolysis of entire nailplate of the left great toe and right 3rd digit. The nailbeds remains intact. There is no erythema, no edema, no drainage, no underlying flocculence.  Interdigital maceration webspaces 1-4 b/l.  No erythema, no edema, no drainage, no open wounds.   Musculoskeletal Examination: Spasticity noted b/l LE.   No pain, crepitus or joint discomfort with active/passive ROM.  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 Toenails 1-5 left, right hallux and right digits 2, 3, 5 were debrided in length and girth without iatrogenic  laceration. Loose nail plates left great toe and right 3rd digit gently removed from their remaining attachment to nailbed. No further treatment required.  2. Rx sent to pharmacy for Nystatin Powder to be applied between toes Apply between toes at noon and 9 pm. 3. Rx for Ciclopirox Gel 0.77% sent to pharmacy. Patient is to apply to both feet and between toes. Apply between toes at 9 am and 5 pm. 4. Continue soft, supportive shoe gear daily. 5. Note written to her assisted living facility to make sure staff is drying well between her toes and under her toes  after baths/showers, incontinence accidents. 6. Report any pedal injuries to medical professional. 7. Follow up 3 months. 8. Patient/POA to call should there be a question/concern in there interim.

## 2019-06-10 DIAGNOSIS — G809 Cerebral palsy, unspecified: Secondary | ICD-10-CM | POA: Diagnosis not present

## 2019-06-10 DIAGNOSIS — H9190 Unspecified hearing loss, unspecified ear: Secondary | ICD-10-CM | POA: Diagnosis not present

## 2019-06-10 DIAGNOSIS — Z0000 Encounter for general adult medical examination without abnormal findings: Secondary | ICD-10-CM | POA: Diagnosis not present

## 2019-06-10 DIAGNOSIS — E781 Pure hyperglyceridemia: Secondary | ICD-10-CM | POA: Diagnosis not present

## 2019-06-10 DIAGNOSIS — R32 Unspecified urinary incontinence: Secondary | ICD-10-CM | POA: Diagnosis not present

## 2019-06-10 DIAGNOSIS — F411 Generalized anxiety disorder: Secondary | ICD-10-CM | POA: Diagnosis not present

## 2019-06-10 DIAGNOSIS — M810 Age-related osteoporosis without current pathological fracture: Secondary | ICD-10-CM | POA: Diagnosis not present

## 2019-06-19 ENCOUNTER — Other Ambulatory Visit: Payer: Self-pay | Admitting: Family Medicine

## 2019-06-19 DIAGNOSIS — Z1231 Encounter for screening mammogram for malignant neoplasm of breast: Secondary | ICD-10-CM

## 2019-07-22 DIAGNOSIS — F410 Panic disorder [episodic paroxysmal anxiety] without agoraphobia: Secondary | ICD-10-CM | POA: Diagnosis not present

## 2019-07-22 DIAGNOSIS — F341 Dysthymic disorder: Secondary | ICD-10-CM | POA: Diagnosis not present

## 2019-09-02 ENCOUNTER — Ambulatory Visit: Payer: Medicare Other

## 2019-09-02 ENCOUNTER — Other Ambulatory Visit: Payer: Self-pay

## 2019-09-02 ENCOUNTER — Ambulatory Visit
Admission: RE | Admit: 2019-09-02 | Discharge: 2019-09-02 | Disposition: A | Discharge status: Home / Self Care | Payer: Medicare Other | Source: Ambulatory Visit | Attending: Family Medicine | Admitting: Family Medicine

## 2019-09-02 DIAGNOSIS — Z1231 Encounter for screening mammogram for malignant neoplasm of breast: Secondary | ICD-10-CM | POA: Diagnosis not present

## 2019-09-03 ENCOUNTER — Ambulatory Visit (INDEPENDENT_AMBULATORY_CARE_PROVIDER_SITE_OTHER): Payer: Medicare Other | Admitting: Podiatry

## 2019-09-03 ENCOUNTER — Encounter: Payer: Self-pay | Admitting: Podiatry

## 2019-09-03 DIAGNOSIS — M79674 Pain in right toe(s): Secondary | ICD-10-CM

## 2019-09-03 DIAGNOSIS — L601 Onycholysis: Secondary | ICD-10-CM | POA: Diagnosis not present

## 2019-09-03 DIAGNOSIS — M79675 Pain in left toe(s): Secondary | ICD-10-CM

## 2019-09-03 DIAGNOSIS — B353 Tinea pedis: Secondary | ICD-10-CM

## 2019-09-03 DIAGNOSIS — B351 Tinea unguium: Secondary | ICD-10-CM

## 2019-09-03 MED ORDER — CICLOPIROX 0.77 % EX GEL: CUTANEOUS | 1 refills | Status: DC

## 2019-09-03 MED ORDER — NYSTATIN 100000 UNIT/GM EX POWD: CUTANEOUS | 2 refills | Status: DC

## 2019-09-03 NOTE — Patient Instructions (Addendum)
Tamara Curry's interdigital tinea pedis has improved!  Continue Nystatin Powder between toes at noon and 9 pm daily.  Continue Ciclopirox Gel 0.77% to both feet and between toes at 9 am and 5 pm.  Make sure staff is drying well between toes and under toes after baths/showers and incontinence accidents.  Onychomycosis/Fungal Toenails  WHAT IS IT? An infection that lies within the keratin of your nail plate that is caused by a fungus.  WHY ME? Fungal infections affect all ages, sexes, races, and creeds.  There may be many factors that predispose you to a fungal infection such as age, coexisting medical conditions such as diabetes, or an autoimmune disease; stress, medications, fatigue, genetics, etc.  Bottom line: fungus thrives in a warm, moist environment and your shoes offer such a location.  IS IT CONTAGIOUS? Theoretically, yes.  You do not want to share shoes, nail clippers or files with someone who has fungal toenails.  Walking around barefoot in the same room or sleeping in the same bed is unlikely to transfer the organism.  It is important to realize, however, that fungus can spread easily from one nail to the next on the same foot.  HOW DO WE TREAT THIS?  There are several ways to treat this condition.  Treatment may depend on many factors such as age, medications, pregnancy, liver and kidney conditions, etc.  It is best to ask your doctor which options are available to you.  1. No treatment.   Unlike many other medical concerns, you can live with this condition.  However for many people this can be a painful condition and may lead to ingrown toenails or a bacterial infection.  It is recommended that you keep the nails cut short to help reduce the amount of fungal nail. 2. Topical treatment.  These range from herbal remedies to prescription strength nail lacquers.  About 40-50% effective, topicals require twice daily application for approximately 9 to 12 months or until an entirely new nail has  grown out.  The most effective topicals are medical grade medications available through physicians offices. 3. Oral antifungal medications.  With an 80-90% cure rate, the most common oral medication requires 3 to 4 months of therapy and stays in your system for a year as the new nail grows out.  Oral antifungal medications do require blood work to make sure it is a safe drug for you.  A liver function panel will be performed prior to starting the medication and after the first month of treatment.  It is important to have the blood work performed to avoid any harmful side effects.  In general, this medication safe but blood work is required. 4. Laser Therapy.  This treatment is performed by applying a specialized laser to the affected nail plate.  This therapy is noninvasive, fast, and non-painful.  It is not covered by insurance and is therefore, out of pocket.  The results have been very good with a 80-95% cure rate.  [facility] is the only practice in the area to offer this therapy. 5. Permanent Nail Avulsion.  Removing the entire nail so that a new nail will not grow back.

## 2019-09-08 NOTE — Progress Notes (Signed)
Subjective: Tamara Curry is a 59 y.o. female patient seen today follow up interdigital tinea pedis webspaces 1-4 b/l and painful mycotic nails b/l that are difficult to trim. Pain interferes with ambulation. Aggravating factors include wearing enclosed shoe gear. Pain is relieved with periodic professional debridement.  She is accompanied by her case worker who stands outside the door today. Case worker tells me there are no new pedal concerns on today's visit. Tamara Curry states group home staff has been applying medication between her toes as instructed. She denies any pain or new complaints on today.  Patient Active Problem List   Diagnosis Date Noted  . Adjustment disorder with depressed mood 05/30/2014  . Suicide threat or attempt 05/30/2014    Current Outpatient Medications on File Prior to Visit  Medication Sig Dispense Refill  . Calcium Carb-Cholecalciferol (CALCIUM 600 + D PO) Take by mouth 2 (two) times daily.    . Ciprofloxacin HCl 0.2 % otic solution     . citalopram (CELEXA) 40 MG tablet     . clonazePAM (KLONOPIN) 0.5 MG tablet Take 0.5 mg by mouth 2 (two) times daily as needed for anxiety.    . clotrimazole-betamethasone (LOTRISONE) cream Apply 1 application topically 2 (two) times daily. 45 g 0  . diphenhydrAMINE (SOMINEX) 25 MG tablet Take 50 mg by mouth at bedtime.     Marland Kitchen doxycycline (VIBRA-TABS) 100 MG tablet Take 1 tablet (100 mg total) by mouth 2 (two) times daily. 20 tablet 0  . doxycycline (VIBRAMYCIN) 100 MG capsule Take 1 capsule (100 mg total) by mouth 2 (two) times daily. 20 capsule 0  . ibuprofen (ADVIL,MOTRIN) 600 MG tablet Take 600 mg by mouth every 6 (six) hours as needed for headache or moderate pain.     Marland Kitchen lamoTRIgine (LAMICTAL) 25 MG tablet     . Multiple Vitamin (MULTIVITAMIN WITH MINERALS) TABS tablet Take 1 tablet by mouth daily.    . Omega-3 Fatty Acids (FISH OIL PO) Take 1 tablet by mouth at bedtime.     Marland Kitchen PARoxetine (PAXIL) 10 MG tablet Take 20 mg by mouth  daily.     Marland Kitchen PROLIA 60 MG/ML SOSY injection     . SHINGRIX injection     . trimethoprim-polymyxin b (POLYTRIM) ophthalmic solution INSTILL 2 DROPS INTO AFFECTED EYE 4 TIMES DAILY FOR 7 DAYS    . venlafaxine XR (EFFEXOR-XR) 75 MG 24 hr capsule Take 75 mg by mouth daily with breakfast.     No current facility-administered medications on file prior to visit.    Allergies  Allergen Reactions  . Penicillins Other (See Comments)    Hot feeling in mouth   Objective: Physical Exam  General: 59 y.o. pleasant Caucasian female no acute distress. Awake, alert and oriented x 3.  Neurovascular status unchanged b/l.  Capillary refill time to digits immediate b/l. Palpable DP pulses b/l. Palpable PT pulses b/l. Pedal hair present b/l. Skin temperature gradient within normal limits b/l.  Protective sensation intact 5/5 intact bilaterally with 10g monofilament b/l. Clonus negative b/l.  Dermatological:  Pedal skin with normal turgor, texture and tone bilaterally. No open wounds bilaterally. No interdigital macerations bilaterally. Toenails 1-5 b/l elongated, dystrophic, thickened, crumbly with subungual debris and tenderness to dorsal palpation. Anonychia noted L 4th toe and R 4th toe. Nailbed(s) epithelialized.    There is noted onchyolysis of entire nailplate of the right 3rd digit. The nailbed remains intact. Thee is no erythema, no edema, no drainage, no underlying flocculence.   Interdigital maceration  resolved 2nd, 3rd webspace b/l.  No erythema, no edema, no drainage, no breaks in skin. No breaks in skin. No signs of bacterial infection.  She still has maceration of 1st, 4th webspaces b/l. No erythema, no edema, no drainage, no flocculence. No breaks in skin. No signs of bacterial infection.  Musculoskeletal:  Normal muscle strength 5/5 to all lower extremity muscle groups bilaterally. No pain crepitus or joint limitation noted with ROM b/l. Hammertoes noted to the 2-5 bilaterally. Utilizes  wheelchair for mobility assistance.  Assessment and Plan:  1. Tinea pedis of both feet   2. Pain due to onychomycosis of toenails of both feet   3. Onycholysis   -Examined patient. -Toenails L hallux, L 2nd toe, L 3rd toe, L 5th toe, R hallux, R 2nd toe, R 3rd toe and R 5th toe debrided in length and girth without iatrogenic bleeding with sterile nail nipper and dremel.  -Loose nailplate right 3rd digit gently debrided from its remaining attachment to nailbed. -Refilled  Nystatin Powder and Ciclopirox Gel 0.77%. Continue Nystatin Powder between toes at noon and 9 pm daily. Continue Ciclopirox Gel 0.77% to both feet and between toes at 9 am and 5 pm daily. -Patient/POA to call should there be question/concern in the interim. -Patient to continue soft, supportive shoe gear daily. -Patient to report any pedal injuries to medical professional immediately.  Return in about 3 months (around 12/04/2019) for nail trim.  Marzetta Board, DPM

## 2019-10-01 DIAGNOSIS — F410 Panic disorder [episodic paroxysmal anxiety] without agoraphobia: Secondary | ICD-10-CM | POA: Diagnosis not present

## 2019-10-01 DIAGNOSIS — F341 Dysthymic disorder: Secondary | ICD-10-CM | POA: Diagnosis not present

## 2019-12-03 ENCOUNTER — Encounter: Payer: Self-pay | Admitting: Podiatry

## 2019-12-03 ENCOUNTER — Other Ambulatory Visit: Payer: Self-pay

## 2019-12-03 ENCOUNTER — Ambulatory Visit (INDEPENDENT_AMBULATORY_CARE_PROVIDER_SITE_OTHER): Payer: Medicare Other | Admitting: Podiatry

## 2019-12-03 VITALS — Temp 97.2°F

## 2019-12-03 DIAGNOSIS — M79674 Pain in right toe(s): Secondary | ICD-10-CM | POA: Diagnosis not present

## 2019-12-03 DIAGNOSIS — B353 Tinea pedis: Secondary | ICD-10-CM

## 2019-12-03 DIAGNOSIS — B351 Tinea unguium: Secondary | ICD-10-CM

## 2019-12-03 DIAGNOSIS — M79675 Pain in left toe(s): Secondary | ICD-10-CM | POA: Diagnosis not present

## 2019-12-03 MED ORDER — ATHLETES FOOT POWDER SPRAY 2 % EX AERP: INHALATION_SPRAY | CUTANEOUS | 4 refills | Status: DC

## 2019-12-03 NOTE — Patient Instructions (Addendum)
ALL ANTIFUNGAL GEL DISCONTINUED THIS VISIT. WILL START ANTIFUNGAL SPRAY POWDER BETWEEN TOES ONCE DAILY.  APPLY ANTIFUNGAL SPRAY POWDER BETWEEN TOES ONCE DAILY.  AFTER BATH/SHOWER, DRY WELL BETWEEN Tamara Curry'S TOES.   Athlete's Foot  Athlete's foot (tinea pedis) is a fungal infection of the skin on your feet. It often occurs on the skin that is between or underneath the toes. It can also occur on the soles of your feet. The infection can spread from person to person (is contagious). It can also spread when a person's bare feet come in contact with the fungus on shower floors or on items such as shoes. What are the causes? This condition is caused by a fungus that grows in warm, moist places. You can get athlete's foot by sharing shoes, shower stalls, towels, and wet floors with someone who is infected. Not washing your feet or changing your socks often enough can also lead to athlete's foot. What increases the risk? This condition is more likely to develop in:  Men.  People who have a weak body defense system (immune system).  People who have diabetes.  People who use public showers, such as at a gym.  People who wear heavy-duty shoes, such as Environmental manager.  Seasons with warm, humid weather. What are the signs or symptoms? Symptoms of this condition include:  Itchy areas between your toes or on the soles of your feet.  White, flaky, or scaly areas between your toes or on the soles of your feet.  Very itchy small blisters between your toes or on the soles of your feet.  Small cuts in your skin. These cuts can become infected.  Thick or discolored toenails. How is this diagnosed? This condition may be diagnosed with a physical exam and a review of your medical history. Your health care provider may also take a skin or toenail sample to examine under a microscope. How is this treated? This condition is treated with antifungal medicines. These may be applied as  powders, ointments, or creams. In severe cases, an oral antifungal medicine may be given. Follow these instructions at home: Medicines  Apply or take over-the-counter and prescription medicines only as told by your health care provider.  Apply your antifungal medicine as told by your health care provider. Do not stop using the antifungal even if your condition improves. Foot care  Do not scratch your feet.  Keep your feet dry: ? Wear cotton or wool socks. Change your socks every day or if they become wet. ? Wear shoes that allow air to flow, such as sandals or canvas tennis shoes.  Wash and dry your feet, including the area between your toes. Also, wash and dry your feet: ? Every day or as told by your health care provider. ? After exercising. General instructions  Do not let others use towels, shoes, nail clippers, or other personal items that touch your feet.  Protect your feet by wearing sandals in wet areas, such as locker rooms and shared showers.  Keep all follow-up visits as told by your health care provider. This is important.  If you have diabetes, keep your blood sugar under control. Contact a health care provider if:  You have a fever.  You have swelling, soreness, warmth, or redness in your foot.  Your feet are not getting better with treatment.  Your symptoms get worse.  You have new symptoms. Summary  Athlete's foot (tinea pedis) is a fungal infection of the skin on your feet. It  often occurs on skin that is between or underneath the toes.  This condition is caused by a fungus that grows in warm, moist places.  Symptoms include white, flaky, or scaly areas between your toes or on the soles of your feet.  This condition is treated with antifungal medicines.  Keep your feet clean. Always dry them thoroughly. This information is not intended to replace advice given to you by your health care provider. Make sure you discuss any questions you have with your  health care provider. Document Revised: 03/30/2017 Document Reviewed: 01/23/2017 Elsevier Patient Education  [place].

## 2019-12-05 NOTE — Progress Notes (Signed)
Subjective: Tamara Curry is a 59 y.o. female patient seen today follow up interdigital tinea pedis webspaces 1-4 b/l and painful mycotic nails b/l that are difficult to trim. Pain interferes with ambulation. Aggravating factors include wearing enclosed shoe gear. Pain is relieved with periodic professional debridement.  She is accompanied by her case worker who is in the lobby today. [name] would like to know if her feet have improved.  Patient Active Problem List   Diagnosis Date Noted  . Adjustment disorder with depressed mood 05/30/2014  . Suicide threat or attempt 05/30/2014    Current Outpatient Medications on File Prior to Visit  Medication Sig Dispense Refill  . Calcium Carb-Cholecalciferol (CALCIUM 600 + D PO) Take by mouth 2 (two) times daily.    . Ciprofloxacin HCl 0.2 % otic solution     . citalopram (CELEXA) 40 MG tablet     . clonazePAM (KLONOPIN) 0.5 MG tablet Take 0.5 mg by mouth 2 (two) times daily as needed for anxiety.    . diphenhydrAMINE (SOMINEX) 25 MG tablet Take 50 mg by mouth at bedtime.     Marland Kitchen doxycycline (VIBRA-TABS) 100 MG tablet Take 1 tablet (100 mg total) by mouth 2 (two) times daily. 20 tablet 0  . doxycycline (VIBRAMYCIN) 100 MG capsule Take 1 capsule (100 mg total) by mouth 2 (two) times daily. 20 capsule 0  . hydrOXYzine (ATARAX/VISTARIL) 10 MG tablet     . ibuprofen (ADVIL,MOTRIN) 600 MG tablet Take 600 mg by mouth every 6 (six) hours as needed for headache or moderate pain.     Marland Kitchen lamoTRIgine (LAMICTAL) 25 MG tablet     . Multiple Vitamin (MULTIVITAMIN WITH MINERALS) TABS tablet Take 1 tablet by mouth daily.    Marland Kitchen nystatin (MYCOSTATIN/NYSTOP) powder Apply between toes at noon and 9 pm. 60 g 2  . Omega-3 Fatty Acids (FISH OIL PO) Take 1 tablet by mouth at bedtime.     Marland Kitchen PARoxetine (PAXIL) 10 MG tablet Take 20 mg by mouth daily.     Marland Kitchen PROLIA 60 MG/ML SOSY injection     . SHINGRIX injection     . trimethoprim-polymyxin b (POLYTRIM) ophthalmic solution  INSTILL 2 DROPS INTO AFFECTED EYE 4 TIMES DAILY FOR 7 DAYS    . venlafaxine XR (EFFEXOR-XR) 75 MG 24 hr capsule Take 75 mg by mouth daily with breakfast.     No current facility-administered medications on file prior to visit.    Allergies  Allergen Reactions  . Penicillins Other (See Comments)    Hot feeling in mouth   Objective: Physical Exam  General: 59 y.o. pleasant Caucasian female no acute distress. Awake, alert and oriented x 3.  Neurovascular status unchanged b/l.  Capillary refill time to digits immediate b/l. Palpable DP pulses b/l. Palpable PT pulses b/l. Pedal hair present b/l. Skin temperature gradient within normal limits b/l.  Protective sensation intact 5/5 intact bilaterally with 10g monofilament b/l. Clonus negative b/l.  Dermatological:  Pedal skin with normal turgor, texture and tone bilaterally. No open wounds bilaterally. No interdigital macerations bilaterally. Toenails 1-5 b/l elongated, dystrophic, thickened, crumbly with subungual debris and tenderness to dorsal palpation. Anonychia noted L 4th toe and R 4th toe. Nailbed(s) epithelialized.    Interdigital maceration noted webspaces 1-4 b/l. No erythema, no edema, no drainage, no breaks in skin. No breaks in skin. No signs of bacterial infection.  Musculoskeletal:  Normal muscle strength 5/5 to all lower extremity muscle groups bilaterally. No pain crepitus or joint limitation noted  with ROM b/l. Hammertoes noted to the 2-5 bilaterally. Utilizes wheelchair for mobility assistance.  Assessment and Plan:  1. Pain due to onychomycosis of toenails of both feet   2. Tinea pedis of both feet   -Examined patient. -Toenails 1-5 b/l debrided in length and girth without iatrogenic bleeding with sterile nail nipper and dremel.  -Her tinea has returned. She did have some noted improvement on last visit. Discontinue Ciclopirox Gel 0.77% to both feet and between toes at 9 am and 5 pm daily. -Start topical antifungal  spray powder between toes once daily. -Written instructions (AVS) given to Case Worker. -POA to call should there be question/concern in the interim. -Patient to continue soft, supportive shoe gear daily. -Patient to report any pedal injuries to medical professional immediately.  Return in about 3 months (around 03/04/2020) for nail trim.  Marzetta Board, DPM

## 2020-01-01 DIAGNOSIS — F410 Panic disorder [episodic paroxysmal anxiety] without agoraphobia: Secondary | ICD-10-CM | POA: Diagnosis not present

## 2020-01-01 DIAGNOSIS — F341 Dysthymic disorder: Secondary | ICD-10-CM | POA: Diagnosis not present

## 2020-03-04 ENCOUNTER — Other Ambulatory Visit: Payer: Self-pay

## 2020-03-04 ENCOUNTER — Ambulatory Visit (INDEPENDENT_AMBULATORY_CARE_PROVIDER_SITE_OTHER): Payer: Medicare Other | Admitting: Podiatry

## 2020-03-04 DIAGNOSIS — M79674 Pain in right toe(s): Secondary | ICD-10-CM | POA: Diagnosis not present

## 2020-03-04 DIAGNOSIS — B353 Tinea pedis: Secondary | ICD-10-CM

## 2020-03-04 DIAGNOSIS — L601 Onycholysis: Secondary | ICD-10-CM

## 2020-03-04 DIAGNOSIS — M79675 Pain in left toe(s): Secondary | ICD-10-CM

## 2020-03-04 DIAGNOSIS — B351 Tinea unguium: Secondary | ICD-10-CM

## 2020-03-04 NOTE — Patient Instructions (Signed)
APPLY TRIPLE ANTIBIOTIC OINTMENT TO RIGHT 3RD AND RIGHT 4TH TOES ONCE DAILY FOR 3 WEEKS.

## 2020-03-08 ENCOUNTER — Encounter: Payer: Self-pay | Admitting: Podiatry

## 2020-03-08 NOTE — Progress Notes (Signed)
Subjective: Tamara Curry is a 59 y.o. female patient seen today follow up interdigital tinea pedis webspaces 1-4 b/l and painful mycotic nails b/l that are difficult to trim. Pain interferes with ambulation. Aggravating factors include wearing enclosed shoe gear. Pain is relieved with periodic professional debridement.  She is accompanied by her case worker who is in the lobby today. Tamara Curry voices no new pedal problems on today's.  Patient Active Problem List   Diagnosis Date Noted  . Adjustment disorder with depressed mood 05/30/2014  . Suicide threat or attempt 05/30/2014    Current Outpatient Medications on File Prior to Visit  Medication Sig Dispense Refill  . ARIPiprazole (ABILIFY) 5 MG tablet     . Calcium Carb-Cholecalciferol (CALCIUM 600 + D PO) Take by mouth 2 (two) times daily.    . Ciprofloxacin HCl 0.2 % otic solution     . citalopram (CELEXA) 40 MG tablet     . clonazePAM (KLONOPIN) 0.5 MG tablet Take 0.5 mg by mouth 2 (two) times daily as needed for anxiety.    . diphenhydrAMINE (SOMINEX) 25 MG tablet Take 50 mg by mouth at bedtime.     Marland Kitchen doxycycline (VIBRA-TABS) 100 MG tablet Take 1 tablet (100 mg total) by mouth 2 (two) times daily. 20 tablet 0  . doxycycline (VIBRAMYCIN) 100 MG capsule Take 1 capsule (100 mg total) by mouth 2 (two) times daily. 20 capsule 0  . hydrOXYzine (ATARAX/VISTARIL) 10 MG tablet     . ibuprofen (ADVIL,MOTRIN) 600 MG tablet Take 600 mg by mouth every 6 (six) hours as needed for headache or moderate pain.     Marland Kitchen lamoTRIgine (LAMICTAL) 25 MG tablet     . Miconazole Nitrate (ATHLETES FOOT POWDER SPRAY) 2 % AERP Spray between toes of both feet once daily 130 g 4  . Multiple Vitamin (MULTIVITAMIN WITH MINERALS) TABS tablet Take 1 tablet by mouth daily.    Marland Kitchen nystatin (MYCOSTATIN/NYSTOP) powder Apply between toes at noon and 9 pm. 60 g 2  . Omega-3 Fatty Acids (FISH OIL PO) Take 1 tablet by mouth at bedtime.     Marland Kitchen PARoxetine (PAXIL) 10 MG tablet Take  20 mg by mouth daily.     Marland Kitchen PROLIA 60 MG/ML SOSY injection     . SHINGRIX injection     . trimethoprim-polymyxin b (POLYTRIM) ophthalmic solution INSTILL 2 DROPS INTO AFFECTED EYE 4 TIMES DAILY FOR 7 DAYS    . venlafaxine XR (EFFEXOR-XR) 75 MG 24 hr capsule Take 75 mg by mouth daily with breakfast.     No current facility-administered medications on file prior to visit.    Allergies  Allergen Reactions  . Penicillins Other (See Comments)    Hot feeling in mouth   Objective: Physical Exam  General: 59 y.o. pleasant Caucasian female no acute distress. Awake, alert and oriented x 3.  Neurovascular status unchanged b/l.  Capillary refill time to digits immediate b/l. Palpable DP pulses b/l. Palpable PT pulses b/l. Pedal hair present b/l. Skin temperature gradient within normal limits b/l.  Protective sensation intact 5/5 intact bilaterally with 10g monofilament b/l. Clonus negative b/l.  Dermatological:  Pedal skin with normal turgor, texture and tone bilaterally. No open wounds bilaterally. No interdigital macerations bilaterally. Toenails 1-5 b/l elongated, dystrophic, thickened, crumbly with subungual debris and tenderness to dorsal palpation. Anonychia noted L 4th toe and R 4th toe. Nailbed(s) epithelialized.    There is noted onchyolysis of entire nailplate of the right 3rd digit. The nailbed remains intact.  There is no erythema, no edema, no drainage, no underlying fluctuance.  Interdigital maceration noted webspaces 1-4 b/l, improved. No erythema, no edema, no drainage, no breaks in skin. No breaks in skin. No signs of bacterial infection.  Musculoskeletal:  Normal muscle strength 5/5 to all lower extremity muscle groups bilaterally. No pain crepitus or joint limitation noted with ROM b/l. Hammertoes noted to the 2-5 bilaterally. Utilizes wheelchair for mobility assistance.  Assessment and Plan:  No diagnosis found.   -Examined patient. -Toenails 1-5 b/l debrided in length and  girth without iatrogenic bleeding with sterile nail nipper and dremel.  -Continue topical antifungal spray powder between toes once daily. -Right 3rd digit nailplate gently debrided from it's remaining attachment to digit. Nailbed cleansed with alcohol. Triple antibiotic ointment applied. Written instructions given to apply triple antibiotic ointment to right 3rd and right 4th digits once daily for three weeks. -POA to call should there be question/concern in the interim. -Patient to continue soft, supportive shoe gear daily. -Patient to report any pedal injuries to medical professional immediately.  Return in about 3 months (around 06/04/2020).  Marzetta Board, DPM

## 2020-03-31 DIAGNOSIS — F410 Panic disorder [episodic paroxysmal anxiety] without agoraphobia: Secondary | ICD-10-CM | POA: Diagnosis not present

## 2020-03-31 DIAGNOSIS — F341 Dysthymic disorder: Secondary | ICD-10-CM | POA: Diagnosis not present

## 2020-04-06 DIAGNOSIS — Z20822 Contact with and (suspected) exposure to covid-19: Secondary | ICD-10-CM | POA: Diagnosis not present

## 2020-04-06 DIAGNOSIS — J3489 Other specified disorders of nose and nasal sinuses: Secondary | ICD-10-CM | POA: Diagnosis not present

## 2020-04-06 DIAGNOSIS — J029 Acute pharyngitis, unspecified: Secondary | ICD-10-CM | POA: Diagnosis not present

## 2020-04-08 DIAGNOSIS — Z23 Encounter for immunization: Secondary | ICD-10-CM | POA: Diagnosis not present

## 2020-04-21 DIAGNOSIS — U071 COVID-19: Secondary | ICD-10-CM | POA: Diagnosis not present

## 2020-04-21 DIAGNOSIS — R059 Cough, unspecified: Secondary | ICD-10-CM | POA: Diagnosis not present

## 2020-06-15 ENCOUNTER — Ambulatory Visit (INDEPENDENT_AMBULATORY_CARE_PROVIDER_SITE_OTHER): Payer: Medicare Other | Admitting: Podiatry

## 2020-06-15 ENCOUNTER — Encounter: Payer: Self-pay | Admitting: Podiatry

## 2020-06-15 ENCOUNTER — Other Ambulatory Visit: Payer: Self-pay

## 2020-06-15 DIAGNOSIS — F419 Anxiety disorder, unspecified: Secondary | ICD-10-CM | POA: Insufficient documentation

## 2020-06-15 DIAGNOSIS — G809 Cerebral palsy, unspecified: Secondary | ICD-10-CM | POA: Insufficient documentation

## 2020-06-15 DIAGNOSIS — M21619 Bunion of unspecified foot: Secondary | ICD-10-CM | POA: Insufficient documentation

## 2020-06-15 DIAGNOSIS — M810 Age-related osteoporosis without current pathological fracture: Secondary | ICD-10-CM | POA: Insufficient documentation

## 2020-06-15 DIAGNOSIS — H9190 Unspecified hearing loss, unspecified ear: Secondary | ICD-10-CM | POA: Insufficient documentation

## 2020-06-15 DIAGNOSIS — R32 Unspecified urinary incontinence: Secondary | ICD-10-CM | POA: Insufficient documentation

## 2020-06-15 DIAGNOSIS — B353 Tinea pedis: Secondary | ICD-10-CM

## 2020-06-15 DIAGNOSIS — E781 Pure hyperglyceridemia: Secondary | ICD-10-CM | POA: Insufficient documentation

## 2020-06-15 DIAGNOSIS — B351 Tinea unguium: Secondary | ICD-10-CM

## 2020-06-15 DIAGNOSIS — Z860100 Personal history of colon polyps, unspecified: Secondary | ICD-10-CM | POA: Insufficient documentation

## 2020-06-15 DIAGNOSIS — M818 Other osteoporosis without current pathological fracture: Secondary | ICD-10-CM | POA: Insufficient documentation

## 2020-06-15 NOTE — Progress Notes (Signed)
Subjective: Tamara Curry is a 60 y.o. female patient seen today follow up interdigital tinea pedis webspaces 1-4 b/l and painful mycotic nails b/l that are difficult to trim. Pain interferes with ambulation. Aggravating factors include wearing enclosed shoe gear. Pain is relieved with periodic professional debridement.  She is accompanied by her case worker, Albina Billet, today. Miss Galant voices no new pedal problems on today's.  Allergies  Allergen Reactions  . Penicillin G     Other reaction(s): as a child  . Penicillins Other (See Comments)    Hot feeling in mouth   Objective: Physical Exam  General: 60 y.o. pleasant Caucasian female no acute distress. Awake, alert and oriented x 3.  Neurovascular status unchanged b/l.  Capillary refill time to digits immediate b/l. Palpable DP pulses b/l. Palpable PT pulses b/l. Pedal hair present b/l. Skin temperature gradient within normal limits b/l.  Protective sensation intact 5/5 intact bilaterally with 10g monofilament b/l. Clonus negative b/l.  Dermatological:  Pedal skin with normal turgor, texture and tone bilaterally. No open wounds bilaterally. Interdigital maceration noted webspace(s) 1-4 b/l. No blistering, no weeping, no open wounds. Toenails 1-5 b/l elongated, discolored, dystrophic, thickened, crumbly with subungual debris and tenderness to dorsal palpation.  Musculoskeletal:  Normal muscle strength 5/5 to all lower extremity muscle groups bilaterally. No pain crepitus or joint limitation noted with ROM b/l. Hammertoes noted to the 2-5 bilaterally. Utilizes wheelchair for mobility assistance.  Assessment and Plan:  1. Pain due to onychomycosis of toenails of both feet   2. Tinea pedis of both feet      -Examined patient. Discussed findings with Case Worker, Albina Billet. -Toenails 1-5 b/l debrided in length and girth without iatrogenic bleeding with sterile nail nipper and dremel.  -Continue topical antifungal spray powder between  toes once daily. -POA to call should there be question/concern in the interim. -Patient to continue soft, supportive shoe gear daily. -Patient to report any pedal injuries to medical professional immediately.  Return in about 3 months (around 09/12/2020).  Marzetta Board, DPM

## 2020-06-18 DIAGNOSIS — M810 Age-related osteoporosis without current pathological fracture: Secondary | ICD-10-CM | POA: Diagnosis not present

## 2020-06-18 DIAGNOSIS — E781 Pure hyperglyceridemia: Secondary | ICD-10-CM | POA: Diagnosis not present

## 2020-06-18 DIAGNOSIS — H9190 Unspecified hearing loss, unspecified ear: Secondary | ICD-10-CM | POA: Diagnosis not present

## 2020-06-18 DIAGNOSIS — R32 Unspecified urinary incontinence: Secondary | ICD-10-CM | POA: Diagnosis not present

## 2020-06-18 DIAGNOSIS — F411 Generalized anxiety disorder: Secondary | ICD-10-CM | POA: Diagnosis not present

## 2020-06-18 DIAGNOSIS — G809 Cerebral palsy, unspecified: Secondary | ICD-10-CM | POA: Diagnosis not present

## 2020-06-18 DIAGNOSIS — Z0000 Encounter for general adult medical examination without abnormal findings: Secondary | ICD-10-CM | POA: Diagnosis not present

## 2020-06-23 DIAGNOSIS — F341 Dysthymic disorder: Secondary | ICD-10-CM | POA: Diagnosis not present

## 2020-06-23 DIAGNOSIS — F410 Panic disorder [episodic paroxysmal anxiety] without agoraphobia: Secondary | ICD-10-CM | POA: Diagnosis not present

## 2020-08-13 ENCOUNTER — Telehealth: Payer: Self-pay | Admitting: Gastroenterology

## 2020-08-13 NOTE — Telephone Encounter (Signed)
Good afternoon Dr. Ardis Hughs, we received a referral for patient to have a colonoscopy.  She had one in 2017.  Records will be sent to you.  Can you please advise on scheduling?  Thank you.

## 2020-08-17 NOTE — Telephone Encounter (Signed)
Spoke with patient's care giver to inform patient not due until 10/2022.  Recall added to chart.

## 2020-08-17 NOTE — Telephone Encounter (Signed)
Colonoscopy 2012, Dr. Acquanetta Sit at [facility] gastroenterology found and removed 2 subcentimeter adenomas.  Repeat colonoscopy July 2017 Dr. Acquanetta Sit at [facility] gastroenterology again found to have removed 2 subcentimeter adenomas.  He also noted diverticulosis in the left colon.  He recommended repeat colonoscopy at 5-year interval for surveillance however newest guidelines so that she does not need a repeat surveillance colonoscopy until July 2024.  Can you please let her know and put her in for that recall time?   Thanks

## 2020-09-17 DIAGNOSIS — R195 Other fecal abnormalities: Secondary | ICD-10-CM | POA: Diagnosis not present

## 2020-09-17 DIAGNOSIS — R32 Unspecified urinary incontinence: Secondary | ICD-10-CM | POA: Diagnosis not present

## 2020-09-17 DIAGNOSIS — R531 Weakness: Secondary | ICD-10-CM | POA: Diagnosis not present

## 2020-09-18 DIAGNOSIS — F341 Dysthymic disorder: Secondary | ICD-10-CM | POA: Diagnosis not present

## 2020-09-18 DIAGNOSIS — F410 Panic disorder [episodic paroxysmal anxiety] without agoraphobia: Secondary | ICD-10-CM | POA: Diagnosis not present

## 2020-09-29 ENCOUNTER — Ambulatory Visit: Payer: Medicare Other | Admitting: Podiatry

## 2020-10-12 ENCOUNTER — Telehealth: Payer: Self-pay | Admitting: Podiatry

## 2020-10-12 ENCOUNTER — Ambulatory Visit (INDEPENDENT_AMBULATORY_CARE_PROVIDER_SITE_OTHER): Payer: Medicare Other | Admitting: Podiatry

## 2020-10-12 ENCOUNTER — Other Ambulatory Visit: Payer: Self-pay

## 2020-10-12 DIAGNOSIS — B351 Tinea unguium: Secondary | ICD-10-CM

## 2020-10-12 DIAGNOSIS — M79675 Pain in left toe(s): Secondary | ICD-10-CM

## 2020-10-12 DIAGNOSIS — M79674 Pain in right toe(s): Secondary | ICD-10-CM

## 2020-10-12 NOTE — Progress Notes (Signed)
Patient left with her caretaker prior to my evaluation and treatment of this patient.    Gardiner Barefoot DPM

## 2020-10-12 NOTE — Telephone Encounter (Signed)
Patient came into our office today for an appointment for routine foot care , she also had a sitter her who wouldn't sit in the room with her and states she " doesn't like feet ". I reported her to our practice administrator Keely white and was told to tell her sitter that she needed to be in the exam room with her.  Tamara Curry  our RN / Lind Guest CMA was present when the Sitter got mad and started to cuss in front of other patients and staff. Patient left without being seen. I have contacted  Abigail Butts the CEO of [employer] and left her a voicemail to please contact us.

## 2020-11-06 ENCOUNTER — Ambulatory Visit: Payer: Medicare Other | Admitting: Podiatry

## 2020-11-17 DIAGNOSIS — F341 Dysthymic disorder: Secondary | ICD-10-CM | POA: Diagnosis not present

## 2020-11-17 DIAGNOSIS — F410 Panic disorder [episodic paroxysmal anxiety] without agoraphobia: Secondary | ICD-10-CM | POA: Diagnosis not present

## 2020-12-14 ENCOUNTER — Encounter: Payer: Self-pay | Admitting: Podiatry

## 2020-12-14 ENCOUNTER — Ambulatory Visit (INDEPENDENT_AMBULATORY_CARE_PROVIDER_SITE_OTHER): Payer: Medicare Other | Admitting: Podiatry

## 2020-12-14 ENCOUNTER — Other Ambulatory Visit: Payer: Self-pay

## 2020-12-14 DIAGNOSIS — B353 Tinea pedis: Secondary | ICD-10-CM | POA: Diagnosis not present

## 2020-12-14 DIAGNOSIS — M79674 Pain in right toe(s): Secondary | ICD-10-CM | POA: Diagnosis not present

## 2020-12-14 DIAGNOSIS — M79675 Pain in left toe(s): Secondary | ICD-10-CM

## 2020-12-14 DIAGNOSIS — B351 Tinea unguium: Secondary | ICD-10-CM | POA: Diagnosis not present

## 2020-12-14 NOTE — Patient Instructions (Signed)
Continue to apply topical antfungal spray powder between toes once daily.

## 2020-12-16 NOTE — Progress Notes (Signed)
Subjective: Tamara Curry is a 60 y.o. female patient seen today follow up interdigital tinea pedis webspaces 1-4 b/l and painful mycotic nails b/l that are difficult to trim. Pain interferes with ambulation. Aggravating factors include wearing enclosed shoe gear. Pain is relieved with periodic professional debridement.  She is accompanied by her daytime caregiver, Tamara Curry.  Miss Singley voices no new pedal problems on today's.  Allergies  Allergen Reactions   Penicillin G     Other reaction(s): as a child   Penicillins Other (See Comments)    Hot feeling in mouth   Objective: Physical Exam  General: 60 y.o. pleasant Caucasian female no acute distress. Awake, alert and oriented x 3.  Neurovascular status unchanged b/l.  Capillary refill time to digits immediate b/l. Palpable DP pulses b/l. Palpable PT pulses b/l. Pedal hair present b/l. Skin temperature gradient within normal limits b/l.  Protective sensation intact 5/5 intact bilaterally with 10g monofilament b/l. Clonus negative b/l.  Dermatological:  Pedal skin with normal turgor, texture and tone bilaterally. No open wounds bilaterally. Interdigital maceration noted webspace(s) 1-4 b/l. No blistering, no weeping, no open wounds. Toenails 1-5 b/l elongated, discolored, dystrophic, thickened, crumbly with subungual debris and tenderness to dorsal palpation.  Musculoskeletal:  Normal muscle strength 5/5 to all lower extremity muscle groups bilaterally. No pain crepitus or joint limitation noted with ROM b/l. Hammertoes noted to the 2-5 bilaterally. Utilizes wheelchair for mobility assistance.  Assessment and Plan:  1. Pain due to onychomycosis of toenails of both feet   2. Tinea pedis of both feet   -Examined patient. Discussed findings with caregiver, Tamara Curry. -Toenails 1-5 b/l debrided in length and girth without iatrogenic bleeding with sterile nail nipper and dremel.  -Continue topical antifungal spray powder between toes once daily.   Printed instructions given to caregiver Tamara Curry on today's visit. -POA to call should there be question/concern in the interim. -Patient to continue soft, supportive shoe gear daily. -Patient to report any pedal injuries to medical professional immediately.  Return in about 3 months (around 03/16/2021).  Marzetta Board, DPM

## 2021-02-09 DIAGNOSIS — F410 Panic disorder [episodic paroxysmal anxiety] without agoraphobia: Secondary | ICD-10-CM | POA: Diagnosis not present

## 2021-02-09 DIAGNOSIS — F341 Dysthymic disorder: Secondary | ICD-10-CM | POA: Diagnosis not present

## 2021-02-25 DIAGNOSIS — Z23 Encounter for immunization: Secondary | ICD-10-CM | POA: Diagnosis not present

## 2021-05-03 DIAGNOSIS — F410 Panic disorder [episodic paroxysmal anxiety] without agoraphobia: Secondary | ICD-10-CM | POA: Diagnosis not present

## 2021-05-03 DIAGNOSIS — F341 Dysthymic disorder: Secondary | ICD-10-CM | POA: Diagnosis not present

## 2021-05-31 ENCOUNTER — Ambulatory Visit (INDEPENDENT_AMBULATORY_CARE_PROVIDER_SITE_OTHER): Payer: Self-pay | Admitting: Podiatry

## 2021-05-31 DIAGNOSIS — Z91199 Patient's noncompliance with other medical treatment and regimen due to unspecified reason: Secondary | ICD-10-CM

## 2021-05-31 NOTE — Progress Notes (Signed)
   Complete physical exam  Patient: Tamara Curry   DOB: 02/05/2000   61 y.o. Female  MRN: [id number]  Subjective:    No chief complaint on file.   Tamara Curry is a 61 y.o. female who presents today for a complete physical exam. She reports consuming a {diet types:17450} diet. {types:19826} She generally feels {DESC; WELL/FAIRLY WELL/POORLY:18703}. She reports sleeping {DESC; WELL/FAIRLY WELL/POORLY:18703}. She {does/does not:200015} have additional problems to discuss today.    Most recent fall risk assessment:    10/13/2021   10:42 AM  Fall Risk   Falls in the past year? 0  Number falls in past yr: 0  Injury with Fall? 0  Risk for fall due to : No Fall Risks  Follow up Falls evaluation completed     Most recent depression screenings:    10/13/2021   10:42 AM 09/03/2020   10:46 AM  PHQ 2/9 Scores  PHQ - 2 Score 0 0  PHQ- 9 Score 5     {VISON DENTAL STD PSA (Optional):27386}  {History (Optional):23778}  Patient Care Team: Jessup, Joy, NP as PCP - General (Nurse Practitioner)   Outpatient Medications Prior to Visit  Medication Sig   fluticasone (FLONASE) 50 MCG/ACT nasal spray Place 2 sprays into both nostrils in the morning and at bedtime. After 7 days, reduce to once daily.   norgestimate-ethinyl estradiol (SPRINTEC 28) 0.25-35 MG-MCG tablet Take 1 tablet by mouth daily.   Nystatin POWD Apply liberally to affected area 2 times per day   spironolactone (ALDACTONE) 100 MG tablet Take 1 tablet (100 mg total) by mouth daily.   No facility-administered medications prior to visit.    ROS        Objective:     There were no vitals taken for this visit. {Vitals History (Optional):23777}  Physical Exam   No results found for any visits on 11/18/21. {Show previous labs (optional):23779}    Assessment & Plan:    Routine Health Maintenance and Physical Exam  Immunization History  Administered Date(s) Administered   DTaP 04/21/1999, 06/17/1999,  08/26/1999, 05/11/2000, 11/25/2003   Hepatitis A 09/21/2007, 09/26/2008   Hepatitis B 02/06/1999, 03/16/1999, 08/26/1999   HiB (PRP-OMP) 04/21/1999, 06/17/1999, 08/26/1999, 05/11/2000   IPV 04/21/1999, 06/17/1999, 02/14/2000, 11/25/2003   Influenza,inj,Quad PF,6+ Mos 12/27/2013   Influenza-Unspecified 03/28/2012   MMR 02/13/2001, 11/25/2003   Meningococcal Polysaccharide 09/26/2011   Pneumococcal Conjugate-13 05/11/2000   Pneumococcal-Unspecified 08/26/1999, 11/09/1999   Tdap 09/26/2011   Varicella 02/14/2000, 09/21/2007    Health Maintenance  Topic Date Due   HIV Screening  Never done   Hepatitis C Screening  Never done   INFLUENZA VACCINE  11/16/2021   PAP-Cervical Cytology Screening  11/18/2021 (Originally 02/05/2020)   PAP SMEAR-Modifier  11/18/2021 (Originally 02/05/2020)   TETANUS/TDAP  11/18/2021 (Originally 09/25/2021)   HPV VACCINES  Discontinued   COVID-19 Vaccine  Discontinued    Discussed health benefits of physical activity, and encouraged her to engage in regular exercise appropriate for her age and condition.  Problem List Items Addressed This Visit   None Visit Diagnoses     Annual physical exam    -  Primary   Cervical cancer screening       Need for Tdap vaccination          No follow-ups on file.     Joy Jessup, NP   

## 2021-06-07 ENCOUNTER — Ambulatory Visit (INDEPENDENT_AMBULATORY_CARE_PROVIDER_SITE_OTHER): Payer: Medicare Other | Admitting: Podiatry

## 2021-06-07 ENCOUNTER — Other Ambulatory Visit: Payer: Self-pay

## 2021-06-07 ENCOUNTER — Encounter: Payer: Self-pay | Admitting: Podiatry

## 2021-06-07 DIAGNOSIS — M79675 Pain in left toe(s): Secondary | ICD-10-CM

## 2021-06-07 DIAGNOSIS — M79674 Pain in right toe(s): Secondary | ICD-10-CM | POA: Diagnosis not present

## 2021-06-07 DIAGNOSIS — B353 Tinea pedis: Secondary | ICD-10-CM | POA: Diagnosis not present

## 2021-06-07 DIAGNOSIS — B351 Tinea unguium: Secondary | ICD-10-CM | POA: Diagnosis not present

## 2021-06-09 DIAGNOSIS — H906 Mixed conductive and sensorineural hearing loss, bilateral: Secondary | ICD-10-CM | POA: Diagnosis not present

## 2021-06-13 NOTE — Progress Notes (Signed)
°  Subjective:  Patient ID: Tamara Curry, female    DOB: 02/03/1961,  MRN: [id number]  Tamara Curry presents to clinic today for thick, elongated toenails b/l feet which are tender when wearing enclosed shoe gear.  Patient Is accompanied in treatment room by her caregiver. Caregiver, nor Tamara Curry, report any new concerns. Caregiver states they continue to apply spray powder between toes daily to avoid skin breakdown due to interdigital moisture.  New problem(s): None.   PCP is Tamara Neer, MD , and last visit was February 25, 2021.  Allergies  Allergen Reactions   Penicillin G     Other reaction(s): as a child   Penicillins Other (See Comments)    Hot feeling in mouth    Review of Systems: Negative except as noted in the HPI. Objective:   Constitutional Tamara Curry is a pleasant 61 y.o. Caucasian female, in NAD.Marland Kitchen   Vascular CFT immediate b/l LE. Palpable DP/PT pulses b/l LE. Digital hair present b/l. Skin temperature gradient WNL b/l. No pain with calf compression b/l. No edema noted b/l. No cyanosis or clubbing noted b/l LE.  Neurologic Oriented to person, place. Patient unable to follow commands of LE neurological examination due to cognitive deficits. Patient does respond to external noxious stimuli.  Dermatologic Interdigital maceration noted bilateral 1st webspace(s). No blistering, no weeping, no open wounds. Toenails 2-5 bilaterally and R hallux elongated, discolored, dystrophic, thickened, and crumbly with subungual debris and tenderness to dorsal palpation. There is noted onchyolysis of entire nailplate of L hallux.  The nailbeds remain intact. There is no erythema, no edema, no drainage, no underlying fluctuance.  Orthopedic: No pain, crepitus or joint limitation noted with ROM b/l lower extremities. HAV with bunion deformity noted b/l LE. Hammertoe deformity noted 2-5 b/l. Utilizes transport chair for ambulation assistance.   Radiographs: None  Last A1c: No flowsheet data  found.   Assessment:  No diagnosis found. Plan:  Patient was evaluated and treated and all questions answered. Consent given for treatment as described below: -Continue to apply topical antifungal spray powder between toes once daily. -Mycotic toenails 2-5 bilaterally and R hallux were debrided in length and girth with sterile nail nippers and dremel without iatrogenic bleeding. -Loose nailplate L hallux gently debrided to level of adherence. Digit cleansed with alcohol. triple antibiotic ointment applied to nailbed followed by light dressing. No further treatment required by patient. -Patient/POA to call should there be question/concern in the interim.  Return in about 3 months (around 09/04/2021).  Marzetta Board, DPM

## 2021-06-24 ENCOUNTER — Encounter (HOSPITAL_COMMUNITY): Payer: Self-pay | Admitting: Emergency Medicine

## 2021-06-24 ENCOUNTER — Emergency Department (HOSPITAL_COMMUNITY): Payer: Medicare Other

## 2021-06-24 ENCOUNTER — Emergency Department (HOSPITAL_COMMUNITY)
Admission: EM | Admit: 2021-06-24 | Discharge: 2021-06-24 | Disposition: A | Discharge status: Home / Self Care | Payer: Medicare Other | Attending: Emergency Medicine | Admitting: Emergency Medicine

## 2021-06-24 DIAGNOSIS — S0101XA Laceration without foreign body of scalp, initial encounter: Secondary | ICD-10-CM | POA: Insufficient documentation

## 2021-06-24 DIAGNOSIS — S0990XA Unspecified injury of head, initial encounter: Secondary | ICD-10-CM

## 2021-06-24 DIAGNOSIS — Y92009 Unspecified place in unspecified non-institutional (private) residence as the place of occurrence of the external cause: Secondary | ICD-10-CM | POA: Insufficient documentation

## 2021-06-24 DIAGNOSIS — S0003XA Contusion of scalp, initial encounter: Secondary | ICD-10-CM | POA: Diagnosis not present

## 2021-06-24 DIAGNOSIS — W050XXA Fall from non-moving wheelchair, initial encounter: Secondary | ICD-10-CM | POA: Diagnosis not present

## 2021-06-24 NOTE — ED Triage Notes (Signed)
Pt fell in the bathroom about 1 hour ago. Hit the L side of her head. Small laceration noted. No active bleeding. No LOC. PERRLA. ?

## 2021-06-24 NOTE — ED Provider Notes (Signed)
?[facility] DEPT ?Provider Note ? ? ?CSN: [id number] ?Arrival date & time: 06/24/21  2031 ? ?  ? ?History ? ?Chief Complaint  ?Patient presents with  ? Head Injury  ? ? ?Tamara Curry is a 61 y.o. female. ? ?HPI ?She was at home with her caregiver when she fell out of her wheelchair.  Caregiver was in another room and did not see exactly what happened.  She has cerebral palsy.  She has chronic care assistance.  No altered behavior after the fall.  Only injury noted by provider is left scalp laceration. ?  ? ?Home Medications ?Prior to Admission medications   ?Medication Sig Start Date End Date Taking? Authorizing Provider  ?ARIPiprazole (ABILIFY) 5 MG tablet  02/17/20   [provider]  ?Calcium Carb-Cholecalciferol (CALCIUM-VITAMIN D) 600-400 MG-UNIT TABS 2 tablets    [provider]  ?Ciprofloxacin HCl 0.2 % otic solution  11/07/18   [provider]  ?citalopram (CELEXA) 20 MG tablet  10/16/20   [provider]  ?clonazePAM (KLONOPIN) 0.5 MG tablet  08/17/20   [provider]  ?clonazePAM Bobbye Charleston) 1 MG tablet 1/2 tablet 01/15/13   [provider]  ?diphenhydrAMINE (BANOPHEN) 25 mg capsule Take 2 capsules by mouth at bedtime.    [provider]  ?diphenhydrAMINE (SOMINEX) 25 MG tablet Take 50 mg by mouth at bedtime.     [provider]  ?doxycycline (VIBRA-TABS) 100 MG tablet Take 1 tablet (100 mg total) by mouth 2 (two) times daily. 10/08/14   Trula Slade, DPM  ?doxycycline (VIBRAMYCIN) 100 MG capsule Take 1 capsule (100 mg total) by mouth 2 (two) times daily. 02/16/18   Marzetta Board, DPM  ?hydrOXYzine (ATARAX/VISTARIL) 10 MG tablet  11/15/19   [provider]  ?ibuprofen (ADVIL) 200 MG tablet 1 tablet with food or milk as needed    [provider]  ?lamoTRIgine (LAMICTAL) 25 MG tablet 1 tablet    [provider]  ?Miconazole Nitrate (ATHLETES FOOT POWDER SPRAY) 2 % AERP Spray  between toes of both feet once daily 12/03/19   Marzetta Board, DPM  ?Multiple Vitamin (MULTIVITAMIN WITH MINERALS) TABS tablet Take 1 tablet by mouth daily.    [provider]  ?Multiple Vitamins-Minerals (CERTAVITE/ANTIOXIDANTS) TABS 1 tablet    [provider]  ?nystatin (MYCOSTATIN/NYSTOP) powder Apply between toes at noon and 9 pm. 09/03/19   Marzetta Board, DPM  ?Omega-3 Fatty Acids (FISH OIL PO) Take 1 tablet by mouth at bedtime.     [provider]  ?PARoxetine (PAXIL) 10 MG tablet Take 20 mg by mouth daily.     [provider]  ?PROLIA 60 MG/ML SOSY injection  06/05/18   [provider]  ?[facility] injection  06/07/18   [provider]  ?trimethoprim-polymyxin b (POLYTRIM) ophthalmic solution INSTILL 2 DROPS INTO AFFECTED EYE 4 TIMES DAILY FOR 7 DAYS 05/04/18   [provider]  ?venlafaxine XR (EFFEXOR-XR) 75 MG 24 hr capsule Take 75 mg by mouth daily with breakfast.    [provider]  ?   ? ?Allergies    ?Penicillin g and Penicillins   ? ?Review of Systems   ?Review of Systems ? ?Physical Exam ?Updated Vital Signs ?BP (!) 107/92 (BP Location: Right Arm)   Pulse 89   Temp 97.9 ?F (36.6 ?C) (Oral)   Resp 20   SpO2 99%  ?Physical Exam ?Vitals and nursing note reviewed.  ?Constitutional:   ?  General: She is not in acute distress. ?   Appearance: She is well-developed. She is not ill-appearing or diaphoretic.  ?HENT:  ?   Head: Normocephalic.  ?   Comments: Small contusion with  11/2 cm laceration, left parietal region.  No associated crepitation or deformity. ?   Right Ear: External ear normal.  ?   Left Ear: External ear normal.  ?Eyes:  ?   Conjunctiva/sclera: Conjunctivae normal.  ?   Pupils: Pupils are equal, round, and reactive to light.  ?Neck:  ?   Trachea: Phonation normal.  ?Cardiovascular:  ?   Rate and Rhythm: Normal rate and regular rhythm.  ?Pulmonary:  ?   Effort: Pulmonary effort is normal.  ?Abdominal:  ?    Palpations: Abdomen is soft.  ?   Tenderness: There is no abdominal tenderness.  ?Musculoskeletal:     ?   General: Normal range of motion.  ?   Cervical back: Normal range of motion and neck supple.  ?Skin: ?   General: Skin is warm and dry.  ?Neurological:  ?   Mental Status: She is alert.  ?   Cranial Nerves: No cranial nerve deficit.  ?   Motor: No abnormal muscle tone.  ?   Coordination: Coordination abnormal.  ?   Comments: Dysarthria and spasticity, which appears to be chronic.  ?Psychiatric:     ?   Mood and Affect: Mood normal.     ?   Behavior: Behavior normal.  ? ? ?ED Results / Procedures / Treatments   ?Labs ?(all labs ordered are listed, but only abnormal results are displayed) ?Labs Reviewed - No data to display ? ?EKG ?None ? ?Radiology ?No results found. ? ?Procedures ?Marland Kitchen.Laceration Repair ? ?Date/Time: 06/24/2021 10:22 PM ?Performed by: Daleen Bo, MD ?Authorized by: Daleen Bo, MD  ? ?Consent:  ?  Consent obtained:  Verbal ?  Risks discussed:  Infection and pain ?Anesthesia:  ?  Anesthesia method:  None ?Laceration details:  ?  Location:  Scalp ?  Scalp location:  L parietal ?  Length (cm):  1.5 ?  Depth (mm):  8 ?Exploration:  ?  Hemostasis achieved with:  Direct pressure ?  Wound extent: no fascia violation noted, no foreign bodies/material noted, no muscle damage noted, no underlying fracture noted and no vascular damage noted   ?  Contaminated: no   ?Treatment:  ?  Irrigation solution:  Sterile saline ?  Debridement:  None ?Skin repair:  ?  Repair method:  Staples ?  Number of staples:  1 ?Approximation:  ?  Approximation:  Loose ?Post-procedure details:  ?  Dressing:  Open (no dressing)  ? ? ?Medications Ordered in ED ?Medications - No data to display ? ?ED Course/ Medical Decision Making/ A&P ?  ?                        ?Medical Decision Making ?Patient here to be evaluated for fall with scalp laceration.  Mechanism of fall not clear.  Patient was apparently not buckled into her  wheelchair when she fell. ? ?Problems Addressed: ?Injury of head, initial encounter: acute illness or injury ?   Details: Fall from wheelchair with possible injuries to scalp, skull, and brain.  No clinical evidence for cervical spine injury. ? ?Amount and/or Complexity of Data Reviewed ?Independent Historian: caregiver ?   Details: Patient gives history, her speech is very difficult understanding caregiver explains what happened. ?Radiology: ordered and independent interpretation  performed. ?   Details: CT head-no acute abnormality. ? ?Risk ?Decision regarding hospitalization. ?Risk Details: Patient with fall and isolated head injury.  She is at her baseline and no serious injuries were found on evaluation.  Her scalp laceration was sutured.  Laceration is minor.  Doubt spine injury, acute illness causing fall or visceral injury.  No indication for further ED evaluation or hospitalization at this time. ? ? ? ? ? ? ? ? ? ? ?Final Clinical Impression(s) / ED Diagnoses ?Final diagnoses:  ?None  ? ? ?Rx / DC Orders ?ED Discharge Orders   ? ? None  ? ?  ? ? ?  ?Daleen Bo, MD ?06/24/21 2225 ? ?

## 2021-06-24 NOTE — Discharge Instructions (Signed)
Clean the wound of the scalp with soap and water every day.  Take Tylenol if needed for pain.  Return here as needed for problems. ?

## 2021-06-30 DIAGNOSIS — F410 Panic disorder [episodic paroxysmal anxiety] without agoraphobia: Secondary | ICD-10-CM | POA: Diagnosis not present

## 2021-06-30 DIAGNOSIS — F341 Dysthymic disorder: Secondary | ICD-10-CM | POA: Diagnosis not present

## 2021-07-07 DIAGNOSIS — Z1211 Encounter for screening for malignant neoplasm of colon: Secondary | ICD-10-CM | POA: Diagnosis not present

## 2021-07-07 DIAGNOSIS — Z23 Encounter for immunization: Secondary | ICD-10-CM | POA: Diagnosis not present

## 2021-07-07 DIAGNOSIS — F411 Generalized anxiety disorder: Secondary | ICD-10-CM | POA: Diagnosis not present

## 2021-07-07 DIAGNOSIS — E781 Pure hyperglyceridemia: Secondary | ICD-10-CM | POA: Diagnosis not present

## 2021-07-07 DIAGNOSIS — H9190 Unspecified hearing loss, unspecified ear: Secondary | ICD-10-CM | POA: Diagnosis not present

## 2021-07-07 DIAGNOSIS — R32 Unspecified urinary incontinence: Secondary | ICD-10-CM | POA: Diagnosis not present

## 2021-07-07 DIAGNOSIS — Z0000 Encounter for general adult medical examination without abnormal findings: Secondary | ICD-10-CM | POA: Diagnosis not present

## 2021-07-07 DIAGNOSIS — M810 Age-related osteoporosis without current pathological fracture: Secondary | ICD-10-CM | POA: Diagnosis not present

## 2021-07-07 DIAGNOSIS — G809 Cerebral palsy, unspecified: Secondary | ICD-10-CM | POA: Diagnosis not present

## 2021-07-08 ENCOUNTER — Other Ambulatory Visit: Payer: Self-pay | Admitting: Family Medicine

## 2021-07-08 DIAGNOSIS — Z1231 Encounter for screening mammogram for malignant neoplasm of breast: Secondary | ICD-10-CM

## 2021-08-12 ENCOUNTER — Ambulatory Visit: Payer: Medicare Other

## 2021-08-18 ENCOUNTER — Ambulatory Visit
Admission: RE | Admit: 2021-08-18 | Discharge: 2021-08-18 | Disposition: A | Discharge status: Home / Self Care | Payer: Medicare Other | Source: Ambulatory Visit | Attending: Family Medicine | Admitting: Family Medicine

## 2021-08-18 DIAGNOSIS — Z1231 Encounter for screening mammogram for malignant neoplasm of breast: Secondary | ICD-10-CM

## 2021-08-30 DIAGNOSIS — F410 Panic disorder [episodic paroxysmal anxiety] without agoraphobia: Secondary | ICD-10-CM | POA: Diagnosis not present

## 2021-08-30 DIAGNOSIS — F341 Dysthymic disorder: Secondary | ICD-10-CM | POA: Diagnosis not present

## 2021-09-06 ENCOUNTER — Ambulatory Visit (INDEPENDENT_AMBULATORY_CARE_PROVIDER_SITE_OTHER): Payer: Medicare Other | Admitting: Podiatry

## 2021-09-06 DIAGNOSIS — Z91199 Patient's noncompliance with other medical treatment and regimen due to unspecified reason: Secondary | ICD-10-CM

## 2021-09-06 NOTE — Progress Notes (Signed)
   Complete physical exam  Patient: Tamara Curry   DOB: 02/05/2000   61 y.o. Female  MRN: [id number]  Subjective:    No chief complaint on file.   Tamara Curry is a 61 y.o. female who presents today for a complete physical exam. She reports consuming a {diet types:17450} diet. {types:19826} She generally feels {DESC; WELL/FAIRLY WELL/POORLY:18703}. She reports sleeping {DESC; WELL/FAIRLY WELL/POORLY:18703}. She {does/does not:200015} have additional problems to discuss today.    Most recent fall risk assessment:    10/13/2021   10:42 AM  Fall Risk   Falls in the past year? 0  Number falls in past yr: 0  Injury with Fall? 0  Risk for fall due to : No Fall Risks  Follow up Falls evaluation completed     Most recent depression screenings:    10/13/2021   10:42 AM 09/03/2020   10:46 AM  PHQ 2/9 Scores  PHQ - 2 Score 0 0  PHQ- 9 Score 5     {VISON DENTAL STD PSA (Optional):27386}  {History (Optional):23778}  Patient Care Team: Jessup, Joy, NP as PCP - General (Nurse Practitioner)   Outpatient Medications Prior to Visit  Medication Sig   fluticasone (FLONASE) 50 MCG/ACT nasal spray Place 2 sprays into both nostrils in the morning and at bedtime. After 7 days, reduce to once daily.   norgestimate-ethinyl estradiol (SPRINTEC 28) 0.25-35 MG-MCG tablet Take 1 tablet by mouth daily.   Nystatin POWD Apply liberally to affected area 2 times per day   spironolactone (ALDACTONE) 100 MG tablet Take 1 tablet (100 mg total) by mouth daily.   No facility-administered medications prior to visit.    ROS        Objective:     There were no vitals taken for this visit. {Vitals History (Optional):23777}  Physical Exam   No results found for any visits on 11/18/21. {Show previous labs (optional):23779}    Assessment & Plan:    Routine Health Maintenance and Physical Exam  Immunization History  Administered Date(s) Administered   DTaP 04/21/1999, 06/17/1999,  08/26/1999, 05/11/2000, 11/25/2003   Hepatitis A 09/21/2007, 09/26/2008   Hepatitis B 02/06/1999, 03/16/1999, 08/26/1999   HiB (PRP-OMP) 04/21/1999, 06/17/1999, 08/26/1999, 05/11/2000   IPV 04/21/1999, 06/17/1999, 02/14/2000, 11/25/2003   Influenza,inj,Quad PF,6+ Mos 12/27/2013   Influenza-Unspecified 03/28/2012   MMR 02/13/2001, 11/25/2003   Meningococcal Polysaccharide 09/26/2011   Pneumococcal Conjugate-13 05/11/2000   Pneumococcal-Unspecified 08/26/1999, 11/09/1999   Tdap 09/26/2011   Varicella 02/14/2000, 09/21/2007    Health Maintenance  Topic Date Due   HIV Screening  Never done   Hepatitis C Screening  Never done   INFLUENZA VACCINE  11/16/2021   PAP-Cervical Cytology Screening  11/18/2021 (Originally 02/05/2020)   PAP SMEAR-Modifier  11/18/2021 (Originally 02/05/2020)   TETANUS/TDAP  11/18/2021 (Originally 09/25/2021)   HPV VACCINES  Discontinued   COVID-19 Vaccine  Discontinued    Discussed health benefits of physical activity, and encouraged her to engage in regular exercise appropriate for her age and condition.  Problem List Items Addressed This Visit   None Visit Diagnoses     Annual physical exam    -  Primary   Cervical cancer screening       Need for Tdap vaccination          No follow-ups on file.     Joy Jessup, NP   

## 2021-10-12 DIAGNOSIS — R400 Somnolence: Secondary | ICD-10-CM | POA: Diagnosis not present

## 2021-10-12 DIAGNOSIS — R5383 Other fatigue: Secondary | ICD-10-CM | POA: Diagnosis not present

## 2021-10-12 DIAGNOSIS — G809 Cerebral palsy, unspecified: Secondary | ICD-10-CM | POA: Diagnosis not present

## 2021-11-01 DIAGNOSIS — F341 Dysthymic disorder: Secondary | ICD-10-CM | POA: Diagnosis not present

## 2021-11-01 DIAGNOSIS — F410 Panic disorder [episodic paroxysmal anxiety] without agoraphobia: Secondary | ICD-10-CM | POA: Diagnosis not present

## 2021-11-04 ENCOUNTER — Encounter (HOSPITAL_COMMUNITY): Payer: Self-pay | Admitting: Emergency Medicine

## 2021-11-04 ENCOUNTER — Emergency Department (HOSPITAL_COMMUNITY)
Admission: EM | Admit: 2021-11-04 | Discharge: 2021-11-04 | Disposition: A | Discharge status: Home / Self Care | Payer: Medicare Other | Attending: Emergency Medicine | Admitting: Emergency Medicine

## 2021-11-04 DIAGNOSIS — K047 Periapical abscess without sinus: Secondary | ICD-10-CM

## 2021-11-04 DIAGNOSIS — R220 Localized swelling, mass and lump, head: Secondary | ICD-10-CM | POA: Diagnosis not present

## 2021-11-04 MED ORDER — AMOXICILLIN 500 MG PO CAPS: 500.0000 mg | ORAL_CAPSULE | Freq: Three times a day (TID) | ORAL | 0 refills | Status: DC

## 2021-11-04 MED ORDER — AMOXICILLIN 500 MG PO CAPS
500.0000 mg | ORAL_CAPSULE | Freq: Once | ORAL | Status: AC
  Administered 2021-11-04: 500 mg via ORAL
  Filled 2021-11-04: qty 1

## 2021-11-04 NOTE — ED Provider Notes (Signed)
[facility] DEPT Provider Note   CSN: [id number] Arrival date & time: 11/04/21  1758     History  Chief Complaint  Patient presents with   Facial Swelling    Tamara Curry is a 61 y.o. female.  Patient is a 61 year old female with a history of cerebral palsy, anxiety, depression who has a caregiver and is being brought in today due to redness, swelling and pain in the left cheek area.  Caregiver reports that for some time now she has been complaining of pain in her left upper teeth.  She did see a dentist and they were planning on having the tooth extracted however they did not take her Medicaid and are still looking for a dentist.  Today she woke up and had redness and swelling of her cheek.  She has still been able to eat has not had fever and denies any trouble swallowing.  The history is provided by a caregiver.       Home Medications Prior to Admission medications   Medication Sig Start Date End Date Taking? Authorizing Provider  amoxicillin (AMOXIL) 500 MG capsule Take 1 capsule (500 mg total) by mouth 3 (three) times daily. 11/04/21  Yes Shakayla Hickox, Loree Fee, MD  ARIPiprazole (ABILIFY) 5 MG tablet  02/17/20   [provider]  Calcium Carb-Cholecalciferol (CALCIUM-VITAMIN D) 600-400 MG-UNIT TABS 2 tablets    [provider]  Ciprofloxacin HCl 0.2 % otic solution  11/07/18   [provider]  citalopram (CELEXA) 20 MG tablet  10/16/20   [provider]  clonazePAM (KLONOPIN) 0.5 MG tablet  08/17/20   [provider]  clonazePAM (KLONOPIN) 1 MG tablet 1/2 tablet 01/15/13   [provider]  diphenhydrAMINE (BANOPHEN) 25 mg capsule Take 2 capsules by mouth at bedtime.    [provider]  diphenhydrAMINE (SOMINEX) 25 MG tablet Take 50 mg by mouth at bedtime.     [provider]  doxycycline (VIBRA-TABS) 100 MG tablet Take 1 tablet (100 mg total) by mouth 2 (two) times daily. 10/08/14    Trula Slade, DPM  doxycycline (VIBRAMYCIN) 100 MG capsule Take 1 capsule (100 mg total) by mouth 2 (two) times daily. 02/16/18   Marzetta Board, DPM  hydrOXYzine (ATARAX/VISTARIL) 10 MG tablet  11/15/19   [provider]  ibuprofen (ADVIL) 200 MG tablet 1 tablet with food or milk as needed    [provider]  lamoTRIgine (LAMICTAL) 25 MG tablet 1 tablet    [provider]  Miconazole Nitrate (ATHLETES FOOT POWDER SPRAY) 2 % AERP Spray between toes of both feet once daily 12/03/19   Marzetta Board, DPM  Multiple Vitamin (MULTIVITAMIN WITH MINERALS) TABS tablet Take 1 tablet by mouth daily.    [provider]  Multiple Vitamins-Minerals (CERTAVITE/ANTIOXIDANTS) TABS 1 tablet    [provider]  nystatin (MYCOSTATIN/NYSTOP) powder Apply between toes at noon and 9 pm. 09/03/19   Marzetta Board, DPM  Omega-3 Fatty Acids (FISH OIL PO) Take 1 tablet by mouth at bedtime.     [provider]  PARoxetine (PAXIL) 10 MG tablet Take 20 mg by mouth daily.     [provider]  PROLIA 60 MG/ML SOSY injection  06/05/18   [provider]  [facility] injection  06/07/18   [provider]  trimethoprim-polymyxin b (POLYTRIM) ophthalmic solution INSTILL 2 DROPS INTO AFFECTED EYE 4 TIMES DAILY FOR 7 DAYS 05/04/18   [provider]  venlafaxine XR (  EFFEXOR-XR) 75 MG 24 hr capsule Take 75 mg by mouth daily with breakfast.    [provider]      Allergies    Penicillin g and Penicillins    Review of Systems   Review of Systems  Physical Exam Updated Vital Signs BP (!) 174/151 (BP Location: Left Arm)   Pulse 73   Temp 98.1 F (36.7 C) (Oral)   Resp 18   SpO2 94%  Physical Exam Vitals and nursing note reviewed.  Constitutional:      Appearance: Normal appearance.  HENT:     Head: Normocephalic.     Comments: Left maxillary cheek with swelling and erythema.  No induration or fluctuance     Mouth/Throat:     Mouth: Mucous membranes are moist.   Eyes:     Pupils: Pupils are equal, round, and reactive to light.  Neck:     Comments: No trismus, anterior neck swelling, drooling Cardiovascular:     Rate and Rhythm: Normal rate.  Pulmonary:     Effort: Pulmonary effort is normal.  Musculoskeletal:     Cervical back: Normal range of motion and neck supple. No tenderness.  Neurological:     Mental Status: She is alert. Mental status is at baseline.  Psychiatric:        Mood and Affect: Mood normal.     ED Results / Procedures / Treatments   Labs (all labs ordered are listed, but only abnormal results are displayed) Labs Reviewed - No data to display  EKG None  Radiology No results found.  Procedures Procedures    Medications Ordered in ED Medications  amoxicillin (AMOXIL) capsule 500 mg (has no administration in time range)    ED Course/ Medical Decision Making/ A&P                           Medical Decision Making Risk Prescription drug management.   Pt with dental caries and facial swelling.  No signs of ludwig's angina or difficulty swallowing and no systemic symptoms. Will treat with amoxicillin and have pt f/u with dentist.         Final Clinical Impression(s) / ED Diagnoses Final diagnoses:  Facial swelling  Dental infection    Rx / DC Orders ED Discharge Orders          Ordered    amoxicillin (AMOXIL) 500 MG capsule  3 times daily        11/04/21 1852              Blanchie Dessert, MD 11/04/21 1856

## 2021-11-04 NOTE — Discharge Instructions (Addendum)
You can take another antibiotic pill tonight right before bed.  Also use warm compresses on the face and ice intermittently.  Tylenol as needed for pain every 4-6 hours.

## 2021-11-04 NOTE — ED Notes (Signed)
I provided reinforced discharge education based off of discharge summary/care provided. Pt caregiver acknowledged and understood my education. Pt caregiver had no further questions/concerns for provider/myself.

## 2021-11-04 NOTE — ED Triage Notes (Signed)
Patient here from home - lives with caregiver - reporting left sided facial redness and swelling  that started today.

## 2022-01-04 ENCOUNTER — Ambulatory Visit: Payer: Medicare Other | Admitting: Podiatry

## 2022-01-05 ENCOUNTER — Ambulatory Visit (INDEPENDENT_AMBULATORY_CARE_PROVIDER_SITE_OTHER): Payer: Medicare Other | Admitting: Podiatry

## 2022-01-05 ENCOUNTER — Encounter: Payer: Self-pay | Admitting: Podiatry

## 2022-01-05 DIAGNOSIS — M21619 Bunion of unspecified foot: Secondary | ICD-10-CM

## 2022-01-05 DIAGNOSIS — B351 Tinea unguium: Secondary | ICD-10-CM

## 2022-01-05 DIAGNOSIS — M79674 Pain in right toe(s): Secondary | ICD-10-CM | POA: Diagnosis not present

## 2022-01-05 DIAGNOSIS — M79675 Pain in left toe(s): Secondary | ICD-10-CM

## 2022-01-05 DIAGNOSIS — G809 Cerebral palsy, unspecified: Secondary | ICD-10-CM | POA: Diagnosis not present

## 2022-01-05 NOTE — Progress Notes (Signed)
This patient presents to the office with chief complaint of long thick painful nails.  Patient says the nails are painful walking and wearing shoes.  This patient is unable to self treat.  This patient is unable to trim her nails since she is unable to reach her nails.  She presents to the office for preventative foot care services.  General Appearance  Alert, conversant and in no acute stress.  Vascular  Dorsalis pedis and posterior tibial  pulses are weakly  palpable  bilaterally.  Capillary return is within normal limits  bilaterally. Cold feet. bilaterally.  Neurologic  Senn-Weinstein monofilament wire test within normal limits  bilaterally. Muscle power within normal limits bilaterally.  Nails Thick disfigured discolored nails with subungual debris  from hallux to fifth toes bilaterally. No evidence of bacterial infection or drainage bilaterally.  Orthopedic  No limitations of motion  feet .  No crepitus or effusions noted.  No bony pathology or digital deformities noted.  HAV  B/L.  Hammer toes  B/L.  Skin  normotropic skin with no porokeratosis noted bilaterally.  No signs of infections or ulcers noted.     Onychomycosis  Nails  B/L.  Pain in right toes  Pain in left toes  Debridement of nails both feet followed trimming the nails with dremel tool.   During debridement her right foot spasmed and third toenail nail plate became unattached.  Debrided this nail followed with dremel tool usage and nail bed was smooth. RTC 3 months.   Gardiner Barefoot DPM

## 2022-01-31 DIAGNOSIS — F410 Panic disorder [episodic paroxysmal anxiety] without agoraphobia: Secondary | ICD-10-CM | POA: Diagnosis not present

## 2022-01-31 DIAGNOSIS — F341 Dysthymic disorder: Secondary | ICD-10-CM | POA: Diagnosis not present

## 2022-02-16 DIAGNOSIS — M810 Age-related osteoporosis without current pathological fracture: Secondary | ICD-10-CM | POA: Diagnosis not present

## 2022-02-16 DIAGNOSIS — Z23 Encounter for immunization: Secondary | ICD-10-CM | POA: Diagnosis not present

## 2022-04-07 DIAGNOSIS — R1310 Dysphagia, unspecified: Secondary | ICD-10-CM | POA: Diagnosis not present

## 2022-04-07 DIAGNOSIS — G809 Cerebral palsy, unspecified: Secondary | ICD-10-CM | POA: Diagnosis not present

## 2022-04-08 ENCOUNTER — Other Ambulatory Visit (HOSPITAL_COMMUNITY): Payer: Self-pay

## 2022-04-08 DIAGNOSIS — R1310 Dysphagia, unspecified: Secondary | ICD-10-CM

## 2022-04-08 DIAGNOSIS — R059 Cough, unspecified: Secondary | ICD-10-CM

## 2022-04-20 ENCOUNTER — Ambulatory Visit (INDEPENDENT_AMBULATORY_CARE_PROVIDER_SITE_OTHER): Payer: Medicare Other | Admitting: Podiatry

## 2022-04-20 DIAGNOSIS — Z91199 Patient's noncompliance with other medical treatment and regimen due to unspecified reason: Secondary | ICD-10-CM

## 2022-04-20 NOTE — Progress Notes (Signed)
1. No-show for appointment     

## 2022-05-02 DIAGNOSIS — F410 Panic disorder [episodic paroxysmal anxiety] without agoraphobia: Secondary | ICD-10-CM | POA: Diagnosis not present

## 2022-05-02 DIAGNOSIS — F341 Dysthymic disorder: Secondary | ICD-10-CM | POA: Diagnosis not present

## 2022-05-03 ENCOUNTER — Ambulatory Visit (HOSPITAL_COMMUNITY)
Admission: RE | Admit: 2022-05-03 | Discharge: 2022-05-03 | Disposition: A | Discharge status: Home / Self Care | Payer: Medicare Other | Source: Ambulatory Visit | Attending: Family Medicine | Admitting: Family Medicine

## 2022-05-03 DIAGNOSIS — R1310 Dysphagia, unspecified: Secondary | ICD-10-CM | POA: Insufficient documentation

## 2022-05-03 DIAGNOSIS — R6339 Other feeding difficulties: Secondary | ICD-10-CM | POA: Diagnosis not present

## 2022-05-03 DIAGNOSIS — R059 Cough, unspecified: Secondary | ICD-10-CM | POA: Diagnosis not present

## 2022-05-03 NOTE — Therapy (Signed)
Modified Barium Swallow Progress Note  Patient Details  Name: Tamara Curry MRN: [id number] Date of Birth: 07-26-1960  Today's Date: 05/03/2022  Modified Barium Swallow completed.  Full report located under Chart Review in the Imaging Section.  Brief recommendations include the following:  Clinical Impression Pt presents with functional oropharyngeal swallow, characterized as follows: Oral stage of the swallow is characterized by ataxic, jerky movements of the lips and tongue. Pt opened her mouth wide to accept a bolus, then bit down on the spoon or cup. She required cueing to open her mouth and close her lips intermittently. Slight premature spillage of consistencies presented, likely due at least in part to discoordinated oral prep and propulsion. Thin liquid, nectar thick liquid, and puree/cracker were seen to spill to the level of the pyriform sinus before triggering swallow reflex. Puree texture triggered swallow reflex at the level of the vallecular sinus. Pharyngeal swallow is characterized by trigger of the swallow reflex at the vallecula or pyriforn. Pt independently produced a dry swallow of puree, due to tongue base and vallecular residue. This effectively cleared residual. Pt did not demonstrate post-swallow residue on other consistencies. Flash penetration was seen on large consecutive boluses of thin liquid via straw. A trace amount of thin liquid was noted to stay on the vocal folds x1. Pt was able to demonstrate a throat clear, effectively clearing penetrate. Pt tolerated swallowing a barium tablet with water. It cleared the oropharynx without delay. Esophageal sweep revealed it to be clear. No aspiration seen on this study.   Recommend dys3 (mechanical soft/ chopped) diet with thin liquids. Meds whole with liquid, crush large pills. Minimizing distractions during meals is also important to facilitate pt's attention to eating/drinking. SMALL bites and sips at SLOW rate is recommended  to maximize safety during meals. Safe swallow precautions written and given to [place] (group home caregiver). Please consult again if needs arise.      Swallow Evaluation Recommendations   SLP Diet Recommendations: Dysphagia 3 (Mech soft) solids;Thin liquid   Liquid Administration via: Cup;Straw   Medication Administration: Whole meds with liquid (crush large pills)   Supervision: Patient able to self feed;Full assist for feeding;Staff to assist with self feeding;Full supervision/cueing for compensatory strategies   Compensations: Minimize environmental distractions;Slow rate;Small sips/bites   Postural Changes: Seated upright at 90 degrees;Remain semi-upright after after feeds/meals (Comment)   Oral Care Recommendations: Oral care BID;Staff/trained caregiver to provide oral care      Benno Brensinger B. Quentin Ore, [facility], [place] Speech Language Pathologist Office: [phone]  Shonna Chock 05/03/2022,2:40 PM

## 2022-05-06 ENCOUNTER — Ambulatory Visit (INDEPENDENT_AMBULATORY_CARE_PROVIDER_SITE_OTHER): Payer: Medicare Other | Admitting: Podiatry

## 2022-05-06 DIAGNOSIS — Z91199 Patient's noncompliance with other medical treatment and regimen due to unspecified reason: Secondary | ICD-10-CM

## 2022-05-06 NOTE — Progress Notes (Signed)
No show.

## 2022-05-16 ENCOUNTER — Encounter: Payer: Self-pay | Admitting: Podiatry

## 2022-05-16 ENCOUNTER — Ambulatory Visit (INDEPENDENT_AMBULATORY_CARE_PROVIDER_SITE_OTHER): Payer: Medicare Other | Admitting: Podiatry

## 2022-05-16 DIAGNOSIS — B351 Tinea unguium: Secondary | ICD-10-CM | POA: Diagnosis not present

## 2022-05-16 DIAGNOSIS — G809 Cerebral palsy, unspecified: Secondary | ICD-10-CM | POA: Diagnosis not present

## 2022-05-16 DIAGNOSIS — M79674 Pain in right toe(s): Secondary | ICD-10-CM | POA: Diagnosis not present

## 2022-05-16 DIAGNOSIS — M79675 Pain in left toe(s): Secondary | ICD-10-CM

## 2022-05-16 NOTE — Progress Notes (Signed)
  Subjective:  Patient ID: Tamara Curry, female    DOB: [birth year],   MRN: [id number]  Chief Complaint  Patient presents with   Nail Problem    Routine foot care, nail trim     62 y.o. female presents for concern of thickened elongated and painful nails that are difficult to trim. Requesting to have them trimmed today. Relates burning and tingling in their feet. Patient is not diabetic but does have a history of cerebral palsy.   PCP:  Mayra Neer, MD    . Denies any other pedal complaints. Denies n/v/f/c.   Past Medical History:  Diagnosis Date   Anxiety    Cerebral palsy ([place])    Depression    Hearing loss    Sinus disorder     Objective:  Physical Exam: Vascular: DP/PT pulses 2/4 bilateral. CFT <3 seconds. Absent hair growth on digits. Edema noted to bilateral lower extremities. Xerosis noted bilaterally.  Skin. No lacerations or abrasions bilateral feet. Nails 1-5 bilateral  are thickened discolored and elongated with subungual debris.  Musculoskeletal: MMT 5/5 bilateral lower extremities in DF, PF, Inversion and Eversion. Deceased ROM in DF of ankle joint.  Neurological: Sensation intact to light touch. Protective sensation diminished bilateral.    Assessment:   1. Pain due to onychomycosis of toenails of both feet   2. Cerebral palsy, unspecified type ([place])      Plan:  Patient was evaluated and treated and all questions answered. -Discussed and educated patient on foot care, especially with  regards to the vascular, neurological and musculoskeletal systems.  -Discussed supportive shoes at all times and checking feet regularly.  -Mechanically debrided all nails 1-5 bilateral using sterile nail nipper and filed with dremel without incident  -Answered all patient questions -Patient to return  in 3 months for at risk foot care -Patient advised to call the office if any problems or questions arise in the meantime.    Lorenda Peck, DPM

## 2022-05-23 ENCOUNTER — Ambulatory Visit (INDEPENDENT_AMBULATORY_CARE_PROVIDER_SITE_OTHER): Payer: Medicare Other | Admitting: Family Medicine

## 2022-05-23 ENCOUNTER — Encounter: Payer: Self-pay | Admitting: Family Medicine

## 2022-05-23 VITALS — BP 124/82 | HR 73 | Ht <= 58 in

## 2022-05-23 DIAGNOSIS — Z113 Encounter for screening for infections with a predominantly sexual mode of transmission: Secondary | ICD-10-CM | POA: Diagnosis not present

## 2022-05-23 DIAGNOSIS — G808 Other cerebral palsy: Secondary | ICD-10-CM | POA: Diagnosis not present

## 2022-05-23 DIAGNOSIS — Z114 Encounter for screening for human immunodeficiency virus [HIV]: Secondary | ICD-10-CM

## 2022-05-23 DIAGNOSIS — Z1159 Encounter for screening for other viral diseases: Secondary | ICD-10-CM

## 2022-05-23 DIAGNOSIS — L750 Bromhidrosis: Secondary | ICD-10-CM | POA: Diagnosis not present

## 2022-05-23 DIAGNOSIS — F419 Anxiety disorder, unspecified: Secondary | ICD-10-CM | POA: Diagnosis not present

## 2022-05-23 DIAGNOSIS — E7849 Other hyperlipidemia: Secondary | ICD-10-CM

## 2022-05-23 DIAGNOSIS — K117 Disturbances of salivary secretion: Secondary | ICD-10-CM | POA: Diagnosis not present

## 2022-05-23 DIAGNOSIS — E038 Other specified hypothyroidism: Secondary | ICD-10-CM

## 2022-05-23 DIAGNOSIS — E559 Vitamin D deficiency, unspecified: Secondary | ICD-10-CM

## 2022-05-23 DIAGNOSIS — R7301 Impaired fasting glucose: Secondary | ICD-10-CM | POA: Diagnosis not present

## 2022-05-23 MED ORDER — UNABLE TO FIND: 0 refills | Status: AC

## 2022-05-23 NOTE — Patient Instructions (Addendum)
   I appreciate the opportunity to provide care to you today!    Follow up:  4 months  Labs: please stop by the lab today to get your blood drawn (CBC, CMP, TSH, Lipid profile, HgA1c, Vit D)  Screening: HIV and Hep C   Please schedule medicare annual wellness     Please continue to a heart-healthy diet and increase your physical activities. Try to exercise for 21mns at least five times a week.      It was a pleasure to see you and I look forward to continuing to work together on your health and well-being. Please do not hesitate to call the office if you need care or have questions about your care.   Have a wonderful day and week. With Gratitude, GAlvira MondayMSN, FNP-BC

## 2022-05-23 NOTE — Progress Notes (Unsigned)
New Patient Office Visit  Subjective:  Patient ID: Tamara Curry, female    DOB: Jan 15, 1961  Age: 62 y.o. MRN: [id number]  CC:  Chief Complaint  Patient presents with   Establish Care    New pt. Needs new pcp. Pt is with director of program of group home today, pt is having an odor since 04/29/2023. Director reports constant drooling would like to discuss options to help with this. Pt needs an order for a hospital bed.     HPI Tamara Curry is a 62 y.o. female with past medical history of cerebral palsy, hearing loss, anxiety, and urinary incontinence presents for establishing care. For the details of today's visit, please refer to the assessment and plan.  The patient is seen today with the director of the group home who is the primary historian.     Past Medical History:  Diagnosis Date   Anxiety    Cerebral palsy ([place])    Depression    Hearing loss    Sinus disorder     Past Surgical History:  Procedure Laterality Date   BRAIN SURGERY      Family History  Problem Relation Age of Onset   Breast cancer Neg Hx     Social History   Socioeconomic History   Marital status: Single    Spouse name: Not on file   Number of children: Not on file   Years of education: Not on file   Highest education level: Not on file  Occupational History   Not on file  Tobacco Use   Smoking status: Never   Smokeless tobacco: Never  Substance and Sexual Activity   Alcohol use: Yes    Alcohol/week: 1.0 standard drink of alcohol    Types: 1 Shots of liquor per week    Comment: occasionally   Drug use: No   Sexual activity: Not on file  Other Topics Concern   Not on file  Social History Narrative   Not on file   Social Determinants of Health   Financial Resource Strain: Not on file  Food Insecurity: Not on file  Transportation Needs: Not on file  Physical Activity: Not on file  Stress: Not on file  Social Connections: Not on file  Intimate Partner Violence: Not on file     ROS Review of Systems  Constitutional:  Negative for chills and fever.  Eyes:  Negative for visual disturbance.  Respiratory:  Negative for chest tightness and shortness of breath.   Neurological:  Negative for dizziness and headaches.    Objective:   Today's Vitals: BP 124/82   Pulse 73   Ht '4\' 8"'$  (1.422 m)   SpO2 99%   BMI 26.90 kg/m   Physical Exam HENT:     Head: Normocephalic.     Mouth/Throat:     Mouth: Mucous membranes are moist.  Cardiovascular:     Rate and Rhythm: Normal rate.     Heart sounds: Normal heart sounds.  Pulmonary:     Effort: Pulmonary effort is normal.     Breath sounds: Normal breath sounds.      Assessment & Plan:   Drooling Assessment & Plan: The patient has cerebral palsy Drooling is likely due to a variety of reasons including abnormalities in swallowing, difficulties moving saliva to the back of the throat, or mouth closure, jaw instability, tongue thrusting, lack of hair control poor posture, lack of sensation around the mouth, breathing through the mouth History of dysphagia Little  hesitant to start patient on anticholinergic due to the side effects of swallowing difficulties, constipation, urinary retention, and blurry vision Encouraged behavioral therapy at the moment    Abnormal body odor Assessment & Plan: Complains of increased bodily odor, noting that the patient receives adequate baths daily Complains of a brownish vaginal discharge Encourage washing the affected areas daily with antibacterial washes and soap Encourage wearing absorbent clothing, particularly cotton, some material that wick moisture away from the skin Encouraged use of antibacterial washes and soaps Will prescribe topical antimicrobial agents such as as benzyl peroxide washes and topical erythromycin to improve odor Pending NuSwab to assess for bacteria infection  Orders: -     Benzoyl Peroxide Wash; Apply topically 2 (two) times daily.  Dispense: 142  g; Refill: 12 -     Erythromycin; Apply topically daily.  Dispense: 30 g; Refill: 0  Anxiety Assessment & Plan: The patient is currently on Celexa 20 mg daily She is following up with her psychiatrist Dr. Darleene Cleaver   Other cerebral palsy ([place]) -     [place]; Med Name: Hosipital Bed G80.9  Dispense: 1 each; Refill: 0  IFG (impaired fasting glucose) -     Hemoglobin A1c  Vitamin D deficiency -     VITAMIN D 25 Hydroxy (Vit-D Deficiency, Fractures)  Need for hepatitis C screening test -     Hepatitis C antibody  Encounter for screening for HIV -     HIV Antibody (routine testing w rflx)  Other specified hypothyroidism -     TSH + free T4 -     Lipid panel  Other hyperlipidemia -     CMP14+EGFR -     CBC with Differential/Platelet  Screen for STD (sexually transmitted disease) -     NuSwab Vaginitis Plus (VG+)     Follow-up: Return in about 4 months (around 09/21/2022).   Alvira Monday, FNP

## 2022-05-24 DIAGNOSIS — Z113 Encounter for screening for infections with a predominantly sexual mode of transmission: Secondary | ICD-10-CM | POA: Diagnosis not present

## 2022-05-24 DIAGNOSIS — L750 Bromhidrosis: Secondary | ICD-10-CM | POA: Insufficient documentation

## 2022-05-24 DIAGNOSIS — K117 Disturbances of salivary secretion: Secondary | ICD-10-CM | POA: Insufficient documentation

## 2022-05-24 MED ORDER — BENZOYL PEROXIDE WASH 5 % EX LIQD: Freq: Two times a day (BID) | CUTANEOUS | 12 refills | Status: DC

## 2022-05-24 MED ORDER — ERYTHROMYCIN 2 % EX GEL: Freq: Every day | CUTANEOUS | 0 refills | Status: DC

## 2022-05-24 NOTE — Assessment & Plan Note (Addendum)
The patient has cerebral palsy Drooling is likely due to a variety of reasons including abnormalities in swallowing, difficulties moving saliva to the back of the throat, or mouth closure, jaw instability, tongue thrusting, lack of hair control poor posture, lack of sensation around the mouth, breathing through the mouth History of dysphagia Little hesitant to start patient on anticholinergic due to the side effects of swallowing difficulties, constipation, urinary retention, and blurry vision Encouraged behavioral therapy at the moment

## 2022-05-24 NOTE — Assessment & Plan Note (Addendum)
Complains of increased bodily odor, noting that the patient receives adequate baths daily Complains of a brownish vaginal discharge Encourage washing the affected areas daily with antibacterial washes and soap Encourage wearing absorbent clothing, particularly cotton, some material that wick moisture away from the skin Encouraged use of antibacterial washes and soaps Will prescribe topical antimicrobial agents such as as benzyl peroxide washes and topical erythromycin to improve odor Pending NuSwab to assess for bacteria infection

## 2022-05-24 NOTE — Assessment & Plan Note (Signed)
The patient is currently on Celexa 20 mg daily She is following up with her psychiatrist Dr. Darleene Cleaver

## 2022-05-28 LAB — NUSWAB VAGINITIS PLUS (VG+)
Candida albicans, NAA: NEGATIVE
Candida glabrata, NAA: NEGATIVE
Chlamydia trachomatis, NAA: NEGATIVE
Neisseria gonorrhoeae, NAA: NEGATIVE
Trich vag by NAA: NEGATIVE

## 2022-05-30 ENCOUNTER — Ambulatory Visit (INDEPENDENT_AMBULATORY_CARE_PROVIDER_SITE_OTHER): Payer: Medicare Other

## 2022-05-30 DIAGNOSIS — Z0000 Encounter for general adult medical examination without abnormal findings: Secondary | ICD-10-CM | POA: Diagnosis not present

## 2022-05-30 NOTE — Patient Instructions (Signed)
  Tamara Curry , Thank you for taking time to come for your Medicare Wellness Visit. I appreciate your ongoing commitment to your health goals. Please review the following plan we discussed and let me know if I can assist you in the future.   These are the goals we discussed:  Goals      Patient Stated     None        This is a list of the screening recommended for you and due dates:  Health Maintenance  Topic Date Due   COVID-19 Vaccine (1) Never done   Hepatitis C Screening: USPSTF Recommendation to screen - Ages 27-79 yo.  Never done   DTaP/Tdap/Td vaccine (1 - Tdap) Never done   Pap Smear  Never done   Flu Shot  07/17/2022*   Colon Cancer Screening  05/24/2023*   Medicare Annual Wellness Visit  05/31/2023   Mammogram  08/19/2023   HIV Screening  Completed   Zoster (Shingles) Vaccine  Completed   HPV Vaccine  Aged Out  *Topic was postponed. The date shown is not the original due date.

## 2022-05-30 NOTE — Progress Notes (Signed)
Please inform the patient she tested negative for yeast infection, bacterial vaginosis, trichomonas, chlamydia and gonorrhea infection

## 2022-05-30 NOTE — Progress Notes (Signed)
Subjective:   Tamara Curry is a 62 y.o. female who presents for Medicare Annual (Subsequent) preventive examination.  Review of Systems    I connected with  [name] on 05/30/22 by a audio enabled telemedicine application and verified that I am speaking with the correct person using two identifiers.  Patient Location: Home  Provider Location: Office/Clinic  I discussed the limitations of evaluation and management by telemedicine. The patient expressed understanding and agreed to proceed.        Objective:    There were no vitals filed for this visit. There is no height or weight on file to calculate BMI.     11/04/2021    6:35 PM 06/24/2021    8:43 PM 07/09/2014    3:10 PM 07/09/2014   12:00 PM 05/30/2014    1:15 PM 05/30/2014   12:14 PM  Advanced Directives  Does Patient Have a Medical Advance Directive? No No No No No No  Would patient like information on creating a medical advance directive?  No - Patient declined  No - patient declined information No - patient declined information No - patient declined information    Current Medications (verified) Outpatient Encounter Medications as of 05/30/2022  Medication Sig   ARIPiprazole (ABILIFY) 5 MG tablet    benzoyl peroxide 5 % external liquid Apply topically 2 (two) times daily.   Calcium Carb-Cholecalciferol (CALCIUM-VITAMIN D) 600-400 MG-UNIT TABS 2 tablets   citalopram (CELEXA) 20 MG tablet    diphenhydrAMINE (BANOPHEN) 25 mg capsule Take 2 capsules by mouth at bedtime.   diphenhydrAMINE (SOMINEX) 25 MG tablet Take 50 mg by mouth at bedtime.  (Patient not taking: Reported on 05/23/2022)   doxycycline (VIBRA-TABS) 100 MG tablet Take 1 tablet (100 mg total) by mouth 2 (two) times daily. (Patient not taking: Reported on 05/23/2022)   doxycycline (VIBRAMYCIN) 100 MG capsule Take 1 capsule (100 mg total) by mouth 2 (two) times daily. (Patient not taking: Reported on 05/23/2022)   erythromycin with ethanol (EMGEL) 2 % gel Apply  topically daily.   hydrOXYzine (ATARAX/VISTARIL) 10 MG tablet  (Patient not taking: Reported on 05/23/2022)   ibuprofen (ADVIL) 200 MG tablet 1 tablet with food or milk as needed (Patient not taking: Reported on 05/23/2022)   lamoTRIgine (LAMICTAL) 25 MG tablet 1 tablet   Miconazole Nitrate (ATHLETES FOOT POWDER SPRAY) 2 % AERP Spray between toes of both feet once daily   Multiple Vitamin (MULTIVITAMIN WITH MINERALS) TABS tablet Take 1 tablet by mouth daily.   SHINGRIX injection  (Patient not taking: Reported on 05/23/2022)   traZODone (DESYREL) 50 MG tablet Take by mouth.   UNABLE TO FIND Med Name: Hosipital Bed G80.9   No facility-administered encounter medications on file as of 05/30/2022.    Allergies (verified) Penicillin g and Penicillins   History: Past Medical History:  Diagnosis Date   Anxiety    Cerebral palsy (HCC)    Depression    Hearing loss    Sinus disorder    Past Surgical History:  Procedure Laterality Date   BRAIN SURGERY     Family History  Problem Relation Age of Onset   Breast cancer Neg Hx    Social History   Socioeconomic History   Marital status: Single    Spouse name: Not on file   Number of children: Not on file   Years of education: Not on file   Highest education level: Not on file  Occupational History   Not on file  Tobacco Use   Smoking status: Never   Smokeless tobacco: Never  Substance and Sexual Activity   Alcohol use: Yes    Alcohol/week: 1.0 standard drink of alcohol    Types: 1 Shots of liquor per week    Comment: occasionally   Drug use: No   Sexual activity: Not on file  Other Topics Concern   Not on file  Social History Narrative   Not on file   Social Determinants of Health   Financial Resource Strain: Not on file  Food Insecurity: Not on file  Transportation Needs: Not on file  Physical Activity: Not on file  Stress: Not on file  Social Connections: Not on file    Tobacco Counseling Counseling given: Not  Answered   Clinical Intake:     Diabetic?NO     Activities of Daily Living     No data to display           Patient Care Team: Alvira Monday, FNP as PCP - General (Family Medicine)  Indicate any recent Medical Services you may have received from other than [facility] providers in the past year (date may be approximate).     Assessment:   This is a routine wellness examination for Tamara Curry.  Hearing/Vision screen No results found.  Dietary issues and exercise activities discussed:     Goals Addressed   None   Depression Screen    05/23/2022    4:28 PM  PHQ 2/9 Scores  PHQ - 2 Score 0  PHQ- 9 Score 0    Fall Risk    05/23/2022    4:28 PM  [place] in the past year? 0  Number falls in past yr: 0  Injury with Fall? 0  Risk for fall due to : No Fall Risks  Follow up Falls evaluation completed    [place]:  Any stairs in or around the home? No  If so, are there any without handrails? No  Home free of loose throw rugs in walkways, pet beds, electrical cords, etc? Yes  Adequate lighting in your home to reduce risk of falls? Yes   ASSISTIVE DEVICES UTILIZED TO PREVENT FALLS:  Life alert? No  Use of a cane, walker or w/c? Yes  Grab bars in the bathroom? Yes  Shower chair or bench in shower? Yes  Elevated toilet seat or a handicapped toilet? Yes           Immunizations Immunization History  Administered Date(s) Administered   Influenza,inj,Quad PF,6+ Mos 03/23/2018   Zoster Recombinat (Shingrix) 06/07/2018, 06/07/2018, 09/05/2018, 09/05/2018    TDAP status: Due, Education has been provided regarding the importance of this vaccine. Advised may receive this vaccine at local pharmacy or Health Dept. Aware to provide a copy of the vaccination record if obtained from local pharmacy or Health Dept. Verbalized acceptance and understanding.  Flu Vaccine status: Due, Education has been provided regarding the  importance of this vaccine. Advised may receive this vaccine at local pharmacy or Health Dept. Aware to provide a copy of the vaccination record if obtained from local pharmacy or Health Dept. Verbalized acceptance and understanding.  Covid-19 vaccine status: Information provided on how to obtain vaccines.   Qualifies for Shingles Vaccine? Yes   Zostavax completed Yes   Shingrix Completed?: Yes  Screening Tests Health Maintenance  Topic Date Due   COVID-19 Vaccine (1) Never done   Hepatitis C Screening  Never done   DTaP/Tdap/Td (  1 - Tdap) Never done   PAP SMEAR-Modifier  Never done   INFLUENZA VACCINE  07/17/2022 (Originally 11/16/2021)   COLONOSCOPY (Pts 45-60yr Insurance coverage will need to be confirmed)  05/24/2023 (Originally 11/05/2004)   [place]  05/31/2023   MAMMOGRAM  08/19/2023   HIV Screening  Completed   Zoster Vaccines- Shingrix  Completed   HPV VACCINES  Aged Out    Health Maintenance  Health Maintenance Due  Topic Date Due   COVID-19 Vaccine (1) Never done   Hepatitis C Screening  Never done   DTaP/Tdap/Td (1 - Tdap) Never done   PAP SMEAR-Modifier  Never done    Colorectal cancer screening: Referral to GI placed  . Pt aware the office will call re: appt.  Mammogram status: Completed 08/18/21. Repeat every year    Lung Cancer Screening: (Low Dose CT Chest recommended if Age 63-80years, 30 pack-year currently smoking OR have quit w/in 15years.) does not qualify.   Lung Cancer Screening Referral: NO  Additional Screening:  Hepatitis C Screening: does qualify; Completed   Vision Screening: Recommended annual ophthalmology exams for early detection of glaucoma and other disorders of the eye. Is the patient up to date with their annual eye exam?  No  Who is the provider or what is the name of the office in which the patient attends annual eye exams? /a If pt is not established with a provider, would they like to be referred to a  provider to establish care? No .   Dental Screening: Recommended annual dental exams for proper oral hygiene  Community Resource Referral / Chronic Care Management: CRR required this visit?  No   CCM required this visit?  No      Plan:     I have personally reviewed and noted the following in the patient's chart:   Medical and social history Use of alcohol, tobacco or illicit drugs  Current medications and supplements including opioid prescriptions. Patient is not currently taking opioid prescriptions. Functional ability and status Nutritional status Physical activity Advanced directives List of other physicians Hospitalizations, surgeries, and ER visits in previous 12 months Vitals Screenings to include cognitive, depression, and falls Referrals and appointments  In addition, I have reviewed and discussed with patient certain preventive protocols, quality metrics, and best practice recommendations. A written personalized care plan for preventive services as well as general preventive health recommendations were provided to patient.     KQuentin Angst [place]  05/30/2022

## 2022-07-20 DIAGNOSIS — E038 Other specified hypothyroidism: Secondary | ICD-10-CM | POA: Diagnosis not present

## 2022-07-20 DIAGNOSIS — E7849 Other hyperlipidemia: Secondary | ICD-10-CM | POA: Diagnosis not present

## 2022-07-20 DIAGNOSIS — Z114 Encounter for screening for human immunodeficiency virus [HIV]: Secondary | ICD-10-CM | POA: Diagnosis not present

## 2022-07-20 DIAGNOSIS — R7301 Impaired fasting glucose: Secondary | ICD-10-CM | POA: Diagnosis not present

## 2022-07-20 DIAGNOSIS — Z1159 Encounter for screening for other viral diseases: Secondary | ICD-10-CM | POA: Diagnosis not present

## 2022-07-20 DIAGNOSIS — E559 Vitamin D deficiency, unspecified: Secondary | ICD-10-CM | POA: Diagnosis not present

## 2022-07-22 LAB — CBC WITH DIFFERENTIAL/PLATELET
Basophils Absolute: 0.1 10*3/uL (ref 0.0–0.2)
Basos: 1 %
EOS (ABSOLUTE): 0.1 10*3/uL (ref 0.0–0.4)
Eos: 2 %
Hematocrit: 33.5 % — ABNORMAL LOW (ref 34.0–46.6)
Hemoglobin: 11.2 g/dL (ref 11.1–15.9)
Immature Grans (Abs): 0 10*3/uL (ref 0.0–0.1)
Immature Granulocytes: 0 %
Lymphocytes Absolute: 1.7 10*3/uL (ref 0.7–3.1)
Lymphs: 33 %
MCH: 31.5 pg (ref 26.6–33.0)
MCHC: 33.4 g/dL (ref 31.5–35.7)
MCV: 94 fL (ref 79–97)
Monocytes Absolute: 0.5 10*3/uL (ref 0.1–0.9)
Monocytes: 10 %
Neutrophils Absolute: 2.7 10*3/uL (ref 1.4–7.0)
Neutrophils: 54 %
Platelets: 350 10*3/uL (ref 150–450)
RBC: 3.56 x10E6/uL — ABNORMAL LOW (ref 3.77–5.28)
RDW: 12.4 % (ref 11.7–15.4)
WBC: 5 10*3/uL (ref 3.4–10.8)

## 2022-07-22 LAB — CMP14+EGFR
ALT: 12 IU/L (ref 0–32)
AST: 14 IU/L (ref 0–40)
Albumin/Globulin Ratio: 1.5 (ref 1.2–2.2)
Albumin: 3.4 g/dL — ABNORMAL LOW (ref 3.9–4.9)
Alkaline Phosphatase: 45 IU/L (ref 44–121)
BUN/Creatinine Ratio: 14 (ref 12–28)
BUN: 12 mg/dL (ref 8–27)
Bilirubin Total: 0.3 mg/dL (ref 0.0–1.2)
CO2: 27 mmol/L (ref 20–29)
Calcium: 8.9 mg/dL (ref 8.7–10.3)
Chloride: 105 mmol/L (ref 96–106)
Creatinine, Ser: 0.84 mg/dL (ref 0.57–1.00)
Globulin, Total: 2.3 g/dL (ref 1.5–4.5)
Glucose: 76 mg/dL (ref 70–99)
Potassium: 4.9 mmol/L (ref 3.5–5.2)
Sodium: 144 mmol/L (ref 134–144)
Total Protein: 5.7 g/dL — ABNORMAL LOW (ref 6.0–8.5)
eGFR: 79 mL/min/{1.73_m2} (ref >59)

## 2022-07-22 LAB — HIV ANTIBODY (ROUTINE TESTING W REFLEX): HIV Screen 4th Generation wRfx: NONREACTIVE

## 2022-07-22 LAB — VITAMIN D 25 HYDROXY (VIT D DEFICIENCY, FRACTURES): Vit D, 25-Hydroxy: 37.3 ng/mL (ref 30.0–100.0)

## 2022-07-22 LAB — HEPATITIS C ANTIBODY: Hep C Virus Ab: NONREACTIVE

## 2022-07-22 LAB — LIPID PANEL
Chol/HDL Ratio: 2.5 ratio (ref 0.0–4.4)
Cholesterol, Total: 142 mg/dL (ref 100–199)
HDL: 56 mg/dL (ref >39)
LDL Chol Calc (NIH): 72 mg/dL (ref 0–99)
Triglycerides: 67 mg/dL (ref 0–149)
VLDL Cholesterol Cal: 14 mg/dL (ref 5–40)

## 2022-07-22 LAB — TSH+FREE T4
Free T4: 1.4 ng/dL (ref 0.82–1.77)
TSH: 2.65 u[IU]/mL (ref 0.450–4.500)

## 2022-07-22 LAB — HEMOGLOBIN A1C
Est. average glucose Bld gHb Est-mCnc: 103 mg/dL
Hgb A1c MFr Bld: 5.2 % (ref 4.8–5.6)

## 2022-07-28 DIAGNOSIS — F410 Panic disorder [episodic paroxysmal anxiety] without agoraphobia: Secondary | ICD-10-CM | POA: Diagnosis not present

## 2022-07-28 DIAGNOSIS — F341 Dysthymic disorder: Secondary | ICD-10-CM | POA: Diagnosis not present

## 2022-08-08 ENCOUNTER — Telehealth: Payer: Self-pay | Admitting: Family Medicine

## 2022-08-08 NOTE — Telephone Encounter (Signed)
Pt had swallowing test, and needs doctor orders to crush pills. Contact group home [phone]

## 2022-08-10 NOTE — Telephone Encounter (Signed)
No, calcium can not be crushed. I recommend taking otc liquid calcium and vit D3

## 2022-08-11 NOTE — Telephone Encounter (Signed)
Group home aware

## 2022-08-12 ENCOUNTER — Telehealth: Payer: Self-pay | Admitting: Family Medicine

## 2022-08-12 ENCOUNTER — Other Ambulatory Visit: Payer: Self-pay | Admitting: Family Medicine

## 2022-08-12 NOTE — Telephone Encounter (Signed)
Spoke to pt let her know this can be bought otc.

## 2022-08-12 NOTE — Telephone Encounter (Signed)
Liquid calcium & Vit D is over the counter

## 2022-08-12 NOTE — Telephone Encounter (Signed)
Group home is calling need to know about this asap having to come into [place] about prescription

## 2022-08-12 NOTE — Telephone Encounter (Signed)
Returning call about prescription needs to be in liquid form calcium and and vitamin D. Also need a copy of the order, contact Clemetine Marker at [facility] group home cell # [phone]. Call [place] when ready.

## 2022-08-16 ENCOUNTER — Other Ambulatory Visit: Payer: Self-pay | Admitting: Family Medicine

## 2022-08-16 ENCOUNTER — Other Ambulatory Visit: Payer: Self-pay

## 2022-08-16 NOTE — Telephone Encounter (Signed)
Kindly update the patient's med list with he liquid Vit D and crushed calcium orders and I'll reorder it

## 2022-08-17 NOTE — Telephone Encounter (Signed)
Tried calling pt to ask details on liquid rx its not in pts history.

## 2022-08-22 NOTE — Telephone Encounter (Signed)
Multiple attempts unable to reach pt.  

## 2022-09-20 DIAGNOSIS — F341 Dysthymic disorder: Secondary | ICD-10-CM | POA: Diagnosis not present

## 2022-09-20 DIAGNOSIS — F410 Panic disorder [episodic paroxysmal anxiety] without agoraphobia: Secondary | ICD-10-CM | POA: Diagnosis not present

## 2022-09-21 ENCOUNTER — Ambulatory Visit: Payer: Medicare Other | Admitting: Family Medicine

## 2022-09-28 ENCOUNTER — Ambulatory Visit (INDEPENDENT_AMBULATORY_CARE_PROVIDER_SITE_OTHER): Payer: Medicare Other | Admitting: Podiatry

## 2022-09-28 ENCOUNTER — Encounter: Payer: Self-pay | Admitting: Podiatry

## 2022-09-28 DIAGNOSIS — M79675 Pain in left toe(s): Secondary | ICD-10-CM

## 2022-09-28 DIAGNOSIS — G809 Cerebral palsy, unspecified: Secondary | ICD-10-CM | POA: Diagnosis not present

## 2022-09-28 DIAGNOSIS — M79674 Pain in right toe(s): Secondary | ICD-10-CM

## 2022-09-28 DIAGNOSIS — B351 Tinea unguium: Secondary | ICD-10-CM | POA: Diagnosis not present

## 2022-09-28 NOTE — Progress Notes (Signed)
  Subjective:  Patient ID: Tamara Curry, female    DOB: Nov 07, 1960,   MRN: [id number]  Chief Complaint  Patient presents with   Nail Problem     Routine foot care/ possible fungus with odor in between toes    62 y.o. female presents for concern of thickened elongated and painful nails that are difficult to trim. Requesting to have them trimmed today. Relates burning and tingling in their feet. Patient is not diabetic but does have a history of cerebral palsy.   PCP:  Gilmore Laroche, FNP    . Denies any other pedal complaints. Denies n/v/f/c.   Past Medical History:  Diagnosis Date   Anxiety    Cerebral palsy (HCC)    Depression    Hearing loss    Sinus disorder     Objective:  Physical Exam: Vascular: DP/PT pulses 2/4 bilateral. CFT <3 seconds. Absent hair growth on digits. Edema noted to bilateral lower extremities. Xerosis noted bilaterally.  Skin. No lacerations or abrasions bilateral feet. Nails 1-5 bilateral  are thickened discolored and elongated with subungual debris.  Musculoskeletal: MMT 5/5 bilateral lower extremities in DF, PF, Inversion and Eversion. Deceased ROM in DF of ankle joint.  Neurological: Sensation intact to light touch. Protective sensation diminished bilateral.    Assessment:   1. Pain due to onychomycosis of toenails of both feet   2. Cerebral palsy, unspecified type (HCC)       Plan:  Patient was evaluated and treated and all questions answered. -Discussed and educated patient on foot care, especially with  regards to the vascular, neurological and musculoskeletal systems.  -Discussed supportive shoes at all times and checking feet regularly.  -Mechanically debrided all nails 1-5 bilateral using sterile nail nipper and filed with dremel without incident  -Answered all patient questions -Patient to return  in 3 months for at risk foot care -Patient advised to call the office if any problems or questions arise in the meantime.    Louann Sjogren, DPM

## 2022-10-04 ENCOUNTER — Ambulatory Visit (INDEPENDENT_AMBULATORY_CARE_PROVIDER_SITE_OTHER): Payer: Medicare Other | Admitting: Family Medicine

## 2022-10-04 ENCOUNTER — Encounter: Payer: Self-pay | Admitting: Family Medicine

## 2022-10-04 VITALS — BP 119/66 | HR 79 | Ht <= 58 in | Wt 87.1 lb

## 2022-10-04 DIAGNOSIS — R634 Abnormal weight loss: Secondary | ICD-10-CM

## 2022-10-04 DIAGNOSIS — M818 Other osteoporosis without current pathological fracture: Secondary | ICD-10-CM

## 2022-10-04 DIAGNOSIS — Z23 Encounter for immunization: Secondary | ICD-10-CM | POA: Diagnosis not present

## 2022-10-04 MED ORDER — ENSURE ENLIVE PO LIQD: 237.0000 mL | Freq: Two times a day (BID) | ORAL | 12 refills | Status: AC

## 2022-10-04 NOTE — Assessment & Plan Note (Signed)
BMI today is 19.52 No systematic symptoms reported, no night sweats reported The caregivers are requesting a prescription for Ensure supplement to aid in weight gain Prescription sent to the pharmacy Encourage high-calorie and high-protein diet

## 2022-10-04 NOTE — Patient Instructions (Addendum)
I appreciate the opportunity to provide care to you today!    Follow up:  3 months  Schedule bone density  I recommend holding the calcium supplement for now until her bone density scan  A prescription for ensure is sent to your pharmacy   Please continue to a heart-healthy diet and increase your physical activities. Try to exercise for at least five days a week.      It was a pleasure to see you and I look forward to continuing to work together on your health and well-being. Please do not hesitate to call the office if you need care or have questions about your care.   Have a wonderful day and week. With Gratitude, Gilmore Laroche MSN, FNP-BC'

## 2022-10-04 NOTE — Assessment & Plan Note (Addendum)
Patient the patient reports taking prolia 60 mg/ml injection every 6 months for osteoporosis No recent records in epic of her bone density screening Will order bone density screening today and consider Fosamax weekly therapy if indicated She reports diarrhea when she takes calcium supplement Encouraged to hold off on calcium supplement until after her bone density screening No recent falls or fractures reported

## 2022-10-04 NOTE — Progress Notes (Signed)
Established Patient Office Visit  Subjective:  Patient ID: Tamara Curry, female    DOB: [birth year]  Age: 62 y.o. MRN: [id number]  CC:  Chief Complaint  Patient presents with   Chronic Care Management    4 month f/u, caregiver has concerns about her weight, would like to discuss prolia shot.     HPI Tamara Curry is a 62 y.o. female with past medical history of cerebral palsy and drooling presents for f/u of  chronic medical conditions.   Past Medical History:  Diagnosis Date   Anxiety    Cerebral palsy (HCC)    Depression    Hearing loss    Sinus disorder     Past Surgical History:  Procedure Laterality Date   BRAIN SURGERY      Family History  Problem Relation Age of Onset   Breast cancer Neg Hx     Social History   Socioeconomic History   Marital status: Single    Spouse name: Not on file   Number of children: Not on file   Years of education: Not on file   Highest education level: Not on file  Occupational History   Not on file  Tobacco Use   Smoking status: Never   Smokeless tobacco: Never  Substance and Sexual Activity   Alcohol use: Yes    Alcohol/week: 1.0 standard drink of alcohol    Types: 1 Shots of liquor per week    Comment: occasionally   Drug use: No   Sexual activity: Not on file  Other Topics Concern   Not on file  Social History Narrative   Not on file   Social Determinants of Health   Financial Resource Strain: Low Risk  (05/30/2022)   Overall Financial Resource Strain (CARDIA)    Difficulty of Paying Living Expenses: Not hard at all  Food Insecurity: No Food Insecurity (05/30/2022)   Hunger Vital Sign    Worried About Running Out of Food in the Last Year: Never true    Ran Out of Food in the Last Year: Never true  Transportation Needs: No Transportation Needs (05/30/2022)   PRAPARE - Administrator, Civil Service (Medical): No    Lack of Transportation (Non-Medical): No  Physical Activity: Inactive (05/30/2022)    Exercise Vital Sign    Days of Exercise per Week: 0 days    Minutes of Exercise per Session: 0 min  Stress: No Stress Concern Present (05/30/2022)   [employer] of Occupational Health - Occupational Stress Questionnaire    Feeling of Stress : Not at all  Social Connections: Unknown (05/30/2022)   Social Connection and Isolation Panel [NHANES]    Frequency of Communication with Friends and Family: More than three times a week    Frequency of Social Gatherings with Friends and Family: More than three times a week    Attends Religious Services: Never    Database administrator or Organizations: No    Attends Banker Meetings: Never    Marital Status: Patient declined  Catering manager Violence: Not At Risk (05/30/2022)   Humiliation, Afraid, Rape, and Kick questionnaire    Fear of Current or Ex-Partner: No    Emotionally Abused: No    Physically Abused: No    Sexually Abused: No    Outpatient Medications Prior to Visit  Medication Sig Dispense Refill   ARIPiprazole (ABILIFY) 5 MG tablet      benzoyl peroxide 5 % external liquid Apply topically  2 (two) times daily. 142 g 12   citalopram (CELEXA) 20 MG tablet      diphenhydrAMINE (BANOPHEN) 25 mg capsule Take 2 capsules by mouth at bedtime.     diphenhydrAMINE (SOMINEX) 25 MG tablet Take 50 mg by mouth at bedtime.     doxycycline (VIBRA-TABS) 100 MG tablet Take 1 tablet (100 mg total) by mouth 2 (two) times daily. 20 tablet 0   doxycycline (VIBRAMYCIN) 100 MG capsule Take 1 capsule (100 mg total) by mouth 2 (two) times daily. 20 capsule 0   erythromycin with ethanol (EMGEL) 2 % gel Apply topically daily. 30 g 0   hydrOXYzine (ATARAX/VISTARIL) 10 MG tablet      ibuprofen (ADVIL) 200 MG tablet      lamoTRIgine (LAMICTAL) 25 MG tablet 1 tablet     Miconazole Nitrate (ATHLETES FOOT POWDER SPRAY) 2 % AERP Spray between toes of both feet once daily 130 g 4   Multiple Vitamin (MULTIVITAMIN WITH MINERALS) TABS tablet Take 1  tablet by mouth daily.     SHINGRIX injection      traZODone (DESYREL) 50 MG tablet Take 50 mg by mouth 2 (two) times daily. 100 mg to sleep better     UNABLE TO FIND Med Name: Hosipital Bed G80.9 1 each 0   No facility-administered medications prior to visit.    Allergies  Allergen Reactions   Penicillin G     Other reaction(s): as a child   Penicillins Other (See Comments)    Hot feeling in mouth    ROS Review of Systems  Constitutional:  Negative for chills and fever.  Eyes:  Negative for visual disturbance.  Respiratory:  Negative for chest tightness and shortness of breath.   Neurological:  Negative for dizziness and headaches.      Objective:    Physical Exam HENT:     Head: Normocephalic.     Mouth/Throat:     Mouth: Mucous membranes are moist.  Cardiovascular:     Rate and Rhythm: Normal rate.     Heart sounds: Normal heart sounds.  Pulmonary:     Effort: Pulmonary effort is normal.     Breath sounds: Normal breath sounds.  Neurological:     Mental Status: She is alert.     BP 119/66   Pulse 79   Ht 4\' 8"  (1.422 m)   Wt 87 lb 1.3 oz (39.5 kg)   SpO2 95%   BMI 19.52 kg/m  Wt Readings from Last 3 Encounters:  10/04/22 87 lb 1.3 oz (39.5 kg)  11/23/14 120 lb (54.4 kg)  10/25/13 119 lb (54 kg)    Lab Results  Component Value Date   TSH 2.650 07/20/2022   Lab Results  Component Value Date   WBC 5.0 07/20/2022   HGB 11.2 07/20/2022   HCT 33.5 (L) 07/20/2022   MCV 94 07/20/2022   PLT 350 07/20/2022   Lab Results  Component Value Date   NA 144 07/20/2022   K 4.9 07/20/2022   CO2 27 07/20/2022   GLUCOSE 76 07/20/2022   BUN 12 07/20/2022   CREATININE 0.84 07/20/2022   BILITOT 0.3 07/20/2022   ALKPHOS 45 07/20/2022   AST 14 07/20/2022   ALT 12 07/20/2022   PROT 5.7 (L) 07/20/2022   ALBUMIN 3.4 (L) 07/20/2022   CALCIUM 8.9 07/20/2022   ANIONGAP 10 11/23/2014   EGFR 79 07/20/2022   Lab Results  Component Value Date   CHOL 142  07/20/2022   Lab  Results  Component Value Date   HDL 56 07/20/2022   Lab Results  Component Value Date   LDLCALC 72 07/20/2022   Lab Results  Component Value Date   TRIG 67 07/20/2022   Lab Results  Component Value Date   CHOLHDL 2.5 07/20/2022   Lab Results  Component Value Date   HGBA1C 5.2 07/20/2022      Assessment & Plan:  Other osteoporosis without current pathological fracture Assessment & Plan: Patient the patient reports taking prolia 60 mg/ml injection every 6 months for osteoporosis No recent records in epic of her bone density screening Will order bone density screening today and consider Fosamax weekly therapy if indicated She reports diarrhea when she takes calcium supplement Encouraged to hold off on calcium supplement until after her bone density screening No recent falls or fractures reported   Orders: -     DG Bone Density  Weight loss Assessment & Plan: BMI today is 19.52 No systematic symptoms reported, no night sweats reported The caregivers are requesting a prescription for Ensure supplement to aid in weight gain Prescription sent to the pharmacy Encourage high-calorie and high-protein diet  Orders: -     Ensure Enlive; Take 237 mLs by mouth 2 (two) times daily between meals.  Dispense: 237 mL; Refill: 12  Need for Tdap vaccination -     Tdap vaccine greater than or equal to 7yo IM  Note: This chart has been completed using Engineer, civil (consulting) software, and while attempts have been made to ensure accuracy, certain words and phrases may not be transcribed as intended.    Follow-up: Return in about 3 months (around 01/04/2023).   Gilmore Laroche, FNP

## 2022-11-14 DIAGNOSIS — F341 Dysthymic disorder: Secondary | ICD-10-CM | POA: Diagnosis not present

## 2022-11-14 DIAGNOSIS — F410 Panic disorder [episodic paroxysmal anxiety] without agoraphobia: Secondary | ICD-10-CM | POA: Diagnosis not present

## 2023-01-04 ENCOUNTER — Ambulatory Visit: Payer: Medicare Other | Admitting: Family Medicine

## 2023-01-04 ENCOUNTER — Ambulatory Visit (INDEPENDENT_AMBULATORY_CARE_PROVIDER_SITE_OTHER): Payer: Medicare Other | Admitting: Podiatry

## 2023-01-04 DIAGNOSIS — Z91199 Patient's noncompliance with other medical treatment and regimen due to unspecified reason: Secondary | ICD-10-CM

## 2023-01-04 NOTE — Progress Notes (Signed)
No show

## 2023-01-05 ENCOUNTER — Encounter: Payer: Self-pay | Admitting: Family Medicine

## 2023-01-11 DIAGNOSIS — F410 Panic disorder [episodic paroxysmal anxiety] without agoraphobia: Secondary | ICD-10-CM | POA: Diagnosis not present

## 2023-01-11 DIAGNOSIS — F341 Dysthymic disorder: Secondary | ICD-10-CM | POA: Diagnosis not present

## 2023-01-18 ENCOUNTER — Other Ambulatory Visit (HOSPITAL_COMMUNITY): Payer: Medicare Other

## 2023-01-25 ENCOUNTER — Ambulatory Visit: Payer: Medicare Other | Admitting: Family Medicine

## 2023-01-30 ENCOUNTER — Other Ambulatory Visit: Payer: Self-pay | Admitting: Family Medicine

## 2023-01-31 ENCOUNTER — Encounter: Payer: Self-pay | Admitting: Family Medicine

## 2023-03-01 DIAGNOSIS — Z23 Encounter for immunization: Secondary | ICD-10-CM | POA: Diagnosis not present

## 2023-03-02 ENCOUNTER — Other Ambulatory Visit: Payer: Self-pay | Admitting: Family Medicine

## 2023-03-31 DIAGNOSIS — F341 Dysthymic disorder: Secondary | ICD-10-CM | POA: Diagnosis not present

## 2023-03-31 DIAGNOSIS — F410 Panic disorder [episodic paroxysmal anxiety] without agoraphobia: Secondary | ICD-10-CM | POA: Diagnosis not present

## 2023-04-27 ENCOUNTER — Other Ambulatory Visit: Payer: Self-pay | Admitting: Family Medicine

## 2023-05-08 ENCOUNTER — Encounter: Payer: Self-pay | Admitting: Family Medicine

## 2023-05-08 ENCOUNTER — Ambulatory Visit (INDEPENDENT_AMBULATORY_CARE_PROVIDER_SITE_OTHER): Payer: Medicare Other | Admitting: Family Medicine

## 2023-05-08 VITALS — BP 122/72 | HR 50 | Ht <= 58 in | Wt 93.0 lb

## 2023-05-08 DIAGNOSIS — N3946 Mixed incontinence: Secondary | ICD-10-CM | POA: Diagnosis not present

## 2023-05-08 DIAGNOSIS — E7849 Other hyperlipidemia: Secondary | ICD-10-CM

## 2023-05-08 DIAGNOSIS — E559 Vitamin D deficiency, unspecified: Secondary | ICD-10-CM

## 2023-05-08 DIAGNOSIS — G808 Other cerebral palsy: Secondary | ICD-10-CM | POA: Diagnosis not present

## 2023-05-08 DIAGNOSIS — R7301 Impaired fasting glucose: Secondary | ICD-10-CM

## 2023-05-08 DIAGNOSIS — E038 Other specified hypothyroidism: Secondary | ICD-10-CM | POA: Diagnosis not present

## 2023-05-08 NOTE — Assessment & Plan Note (Signed)
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 The caregiver is requesting incontinence supplies through [employer]. I recommended contacting [employer] and submitting the necessary forms to complete the process for obtaining covered incontinence supplies. The caregiver verbalized understanding and is aware of the plan of care.

## 2023-05-08 NOTE — Assessment & Plan Note (Signed)
The patient was seen today with her caregiver. She currently resides in a nursing home facility and is receiving Level III care. The caregiver requests that the patient receive Level IV care due to her condition and her ongoing need for consistent supervision with feeding, transferring, bathing, and other basic needs.  The patient reports that a nurse at the facility usually rounds on her. I requested a nurse's note to review the assessment, but it was not brought in today. The caregiver stated she will fax the note for review. After reviewing it, I will determine if Level IV care is indicated and make any necessary adjustments.  The caregiver also requested a new wheelchair for the patient, and an order has been placed. She reports that the patient experiences increased gastrointestinal upset when taking the prescribed oral calcium supplement. That supplement has been discontinued, and I recommended an over-the-counter liquid calcium 1000 mg with vitamin D3 (25 mcg) daily.  The caregiver verbalized understanding and is aware of the plan of care.

## 2023-05-08 NOTE — Progress Notes (Signed)
Established Patient Office Visit  Subjective:  Patient ID: Tamara Curry, female    DOB: [birth year]  Age: 63 y.o. MRN: [id number]  CC:  Chief Complaint  Patient presents with   Forms    Fl2  Personal care forms Needs care plan evaluated. Needs care updated documentation stating that she needs round the clock assistance for level 4 care w/ feeding, transferring, bathing and basic care. All needs to be documented so that she can get the proper living assistance.       HPI Tamara Curry is a 63 y.o. female with past medical history of cerebral palsy, urinary incontinence presents for f/u of  chronic medical conditions. For the details of today's visit, please refer to the assessment and plan.     Past Medical History:  Diagnosis Date   Anxiety    Cerebral palsy (HCC)    Depression    Hearing loss    Sinus disorder     Past Surgical History:  Procedure Laterality Date   BRAIN SURGERY      Family History  Problem Relation Age of Onset   Breast cancer Neg Hx     Social History   Socioeconomic History   Marital status: Single    Spouse name: Not on file   Number of children: Not on file   Years of education: Not on file   Highest education level: Not on file  Occupational History   Not on file  Tobacco Use   Smoking status: Never   Smokeless tobacco: Never  Substance and Sexual Activity   Alcohol use: Yes    Alcohol/week: 1.0 standard drink of alcohol    Types: 1 Shots of liquor per week    Comment: occasionally   Drug use: No   Sexual activity: Not on file  Other Topics Concern   Not on file  Social History Narrative   Not on file   Social Drivers of Health   Financial Resource Strain: Low Risk  (05/30/2022)   Overall Financial Resource Strain (CARDIA)    Difficulty of Paying Living Expenses: Not hard at all  Food Insecurity: No Food Insecurity (05/30/2022)   Hunger Vital Sign    Worried About Running Out of Food in the Last Year: Never true    Ran  Out of Food in the Last Year: Never true  Transportation Needs: No Transportation Needs (05/30/2022)   PRAPARE - Administrator, Civil Service (Medical): No    Lack of Transportation (Non-Medical): No  Physical Activity: Inactive (05/30/2022)   Exercise Vital Sign    Days of Exercise per Week: 0 days    Minutes of Exercise per Session: 0 min  Stress: No Stress Concern Present (05/30/2022)   [employer] of Occupational Health - Occupational Stress Questionnaire    Feeling of Stress : Not at all  Social Connections: Unknown (05/30/2022)   Social Connection and Isolation Panel [NHANES]    Frequency of Communication with Friends and Family: More than three times a week    Frequency of Social Gatherings with Friends and Family: More than three times a week    Attends Religious Services: Never    Database administrator or Organizations: No    Attends Banker Meetings: Never    Marital Status: Patient declined  Intimate Partner Violence: Not At Risk (05/30/2022)   Humiliation, Afraid, Rape, and Kick questionnaire    Fear of Current or Ex-Partner: No    Emotionally Abused:  No    Physically Abused: No    Sexually Abused: No    Outpatient Medications Prior to Visit  Medication Sig Dispense Refill   ARIPiprazole (ABILIFY) 5 MG tablet      benzoyl peroxide 5 % external liquid Apply topically 2 (two) times daily. 142 g 12   Calcium Carb-Cholecalciferol 600-10 MG-MCG TABS TAKE 2 TABLETS BY MOUTH ONCE DAILY WITH FOOD 60 tablet 4   citalopram (CELEXA) 20 MG tablet      diphenhydrAMINE (BENADRYL) 25 mg capsule TAKE 2 CAPSULES BY MOUTH AT BEDTIME 60 capsule 1   diphenhydrAMINE (SOMINEX) 25 MG tablet Take 50 mg by mouth at bedtime.     doxycycline (VIBRA-TABS) 100 MG tablet Take 1 tablet (100 mg total) by mouth 2 (two) times daily. 20 tablet 0   doxycycline (VIBRAMYCIN) 100 MG capsule Take 1 capsule (100 mg total) by mouth 2 (two) times daily. 20 capsule 0   erythromycin  with ethanol (EMGEL) 2 % gel Apply topically daily. 30 g 0   feeding supplement (ENSURE ENLIVE / ENSURE PLUS) LIQD Take 237 mLs by mouth 2 (two) times daily between meals. 237 mL 12   hydrOXYzine (ATARAX/VISTARIL) 10 MG tablet      ibuprofen (ADVIL) 200 MG tablet      lamoTRIgine (LAMICTAL) 25 MG tablet 1 tablet     Miconazole Nitrate (ATHLETES FOOT POWDER SPRAY) 2 % AERP Spray between toes of both feet once daily 130 g 4   Multiple Vitamin (MULTIVITAMIN) TABS TAKE 1 TABLET BY MOUTH ONCE DAILY 28 tablet 2   SHINGRIX injection      traZODone (DESYREL) 50 MG tablet Take 50 mg by mouth 2 (two) times daily. 100 mg to sleep better     UNABLE TO FIND Med Name: Hosipital Bed G80.9 1 each 0   PROLIA 60 MG/ML SOSY injection INJECT 60 MG SUBCUTANEOUSLY EVERY 6 MONTHS (Patient not taking: Reported on 05/08/2023) 1 mL 4   No facility-administered medications prior to visit.    Allergies  Allergen Reactions   Penicillin G     Other reaction(s): as a child   Penicillins Other (See Comments)    Hot feeling in mouth    ROS Review of Systems  Constitutional:  Negative for chills and fever.  Eyes:  Negative for visual disturbance.  Respiratory:  Negative for chest tightness and shortness of breath.   Neurological:  Negative for dizziness and headaches.      Objective:    Physical Exam HENT:     Head: Normocephalic.     Mouth/Throat:     Mouth: Mucous membranes are moist.  Cardiovascular:     Rate and Rhythm: Normal rate.     Heart sounds: Normal heart sounds.  Pulmonary:     Effort: Pulmonary effort is normal.     Breath sounds: Normal breath sounds.  Neurological:     Mental Status: She is alert.     BP 122/72   Pulse (!) 50   Ht 4\' 8"  (1.422 m)   Wt 93 lb 0.6 oz (42.2 kg)   SpO2 91%   BMI 20.86 kg/m  Wt Readings from Last 3 Encounters:  05/08/23 93 lb 0.6 oz (42.2 kg)  10/04/22 87 lb 1.3 oz (39.5 kg)  11/23/14 120 lb (54.4 kg)    Lab Results  Component Value Date    TSH 2.650 07/20/2022   Lab Results  Component Value Date   WBC 5.0 07/20/2022   HGB 11.2 07/20/2022  HCT 33.5 (L) 07/20/2022   MCV 94 07/20/2022   PLT 350 07/20/2022   Lab Results  Component Value Date   NA 144 07/20/2022   K 4.9 07/20/2022   CO2 27 07/20/2022   GLUCOSE 76 07/20/2022   BUN 12 07/20/2022   CREATININE 0.84 07/20/2022   BILITOT 0.3 07/20/2022   ALKPHOS 45 07/20/2022   AST 14 07/20/2022   ALT 12 07/20/2022   PROT 5.7 (L) 07/20/2022   ALBUMIN 3.4 (L) 07/20/2022   CALCIUM 8.9 07/20/2022   ANIONGAP 10 11/23/2014   EGFR 79 07/20/2022   Lab Results  Component Value Date   CHOL 142 07/20/2022   Lab Results  Component Value Date   HDL 56 07/20/2022   Lab Results  Component Value Date   LDLCALC 72 07/20/2022   Lab Results  Component Value Date   TRIG 67 07/20/2022   Lab Results  Component Value Date   CHOLHDL 2.5 07/20/2022   Lab Results  Component Value Date   HGBA1C 5.2 07/20/2022      Assessment & Plan:  Other cerebral palsy [facility]) Assessment & Plan: The patient was seen today with her caregiver. She currently resides in a nursing home facility and is receiving Level III care. The caregiver requests that the patient receive Level IV care due to her condition and her ongoing need for consistent supervision with feeding, transferring, bathing, and other basic needs.  The patient reports that a nurse at the facility usually rounds on her. I requested a nurse's note to review the assessment, but it was not brought in today. The caregiver stated she will fax the note for review. After reviewing it, I will determine if Level IV care is indicated and make any necessary adjustments.  The caregiver also requested a new wheelchair for the patient, and an order has been placed. She reports that the patient experiences increased gastrointestinal upset when taking the prescribed oral calcium supplement. That supplement has been discontinued, and I recommended  an over-the-counter liquid calcium 1000 mg with vitamin D3 (25 mcg) daily.  The caregiver verbalized understanding and is aware of the plan of care.   Orders: -     For home use only DME standard manual wheelchair with seat cushion  Mixed stress and urge urinary incontinence Assessment & Plan: The patient has chronic urinary incontinence due to cerebral palsy and has been obtaining her incontinence supplies over the counter. The caregiver is requesting incontinence supplies through [employer]. I recommended contacting [employer] and submitting the necessary forms to complete the process for obtaining covered incontinence supplies. The caregiver verbalized understanding and is aware of the plan of care.    IFG (impaired fasting glucose) -     Hemoglobin A1c  Vitamin D deficiency -     VITAMIN D 25 Hydroxy (Vit-D Deficiency, Fractures)  TSH (thyroid-stimulating hormone deficiency) -     TSH + free T4  Other hyperlipidemia -     Lipid panel -     CMP14+EGFR -     CBC with Differential/Platelet  Note: This chart has been completed using Engineer, civil (consulting) software, and while attempts have been made to ensure accuracy, certain words and phrases may not be transcribed as intended.    Follow-up: Return in about 4 months (around 09/05/2023).   Gilmore Laroche, FNP

## 2023-05-08 NOTE — Patient Instructions (Addendum)

## 2023-05-09 ENCOUNTER — Telehealth: Payer: Self-pay | Admitting: Family Medicine

## 2023-05-09 NOTE — Telephone Encounter (Signed)
Contacted care giver, they received ordered wheelchair. Caregiver stated that the wheelchair wheels are too high from the ground for pt. Feet to touch and seat is to low causing issues w/ pt. Transport for restroom activities and bathing. Contacted [employer] for troubleshooting. Will reach back out once solution has been made.

## 2023-05-09 NOTE — Telephone Encounter (Signed)
Copied from CRM [id number]. Topic: Clinical - Prescription Issue >> May 09, 2023  1:18 PM Shelah Lewandowsky wrote: Reason for CRM: Wheelchair delivered today will not work for the patient, please call Sena Slate, program director [phone]

## 2023-05-12 DIAGNOSIS — R7301 Impaired fasting glucose: Secondary | ICD-10-CM | POA: Diagnosis not present

## 2023-05-12 DIAGNOSIS — E038 Other specified hypothyroidism: Secondary | ICD-10-CM | POA: Diagnosis not present

## 2023-05-12 DIAGNOSIS — E7849 Other hyperlipidemia: Secondary | ICD-10-CM | POA: Diagnosis not present

## 2023-05-12 DIAGNOSIS — E559 Vitamin D deficiency, unspecified: Secondary | ICD-10-CM | POA: Diagnosis not present

## 2023-05-13 LAB — CBC WITH DIFFERENTIAL/PLATELET
Basophils Absolute: 0.1 10*3/uL (ref 0.0–0.2)
Basos: 1 %
EOS (ABSOLUTE): 0.1 10*3/uL (ref 0.0–0.4)
Eos: 2 %
Hematocrit: 37.5 % (ref 34.0–46.6)
Hemoglobin: 12.4 g/dL (ref 11.1–15.9)
Immature Grans (Abs): 0 10*3/uL (ref 0.0–0.1)
Immature Granulocytes: 0 %
Lymphocytes Absolute: 1.8 10*3/uL (ref 0.7–3.1)
Lymphs: 30 %
MCH: 31.5 pg (ref 26.6–33.0)
MCHC: 33.1 g/dL (ref 31.5–35.7)
MCV: 95 fL (ref 79–97)
Monocytes Absolute: 0.6 10*3/uL (ref 0.1–0.9)
Monocytes: 9 %
Neutrophils Absolute: 3.5 10*3/uL (ref 1.4–7.0)
Neutrophils: 58 %
Platelets: 299 10*3/uL (ref 150–450)
RBC: 3.94 x10E6/uL (ref 3.77–5.28)
RDW: 11.5 % — ABNORMAL LOW (ref 11.7–15.4)
WBC: 6.1 10*3/uL (ref 3.4–10.8)

## 2023-05-13 LAB — LIPID PANEL
Chol/HDL Ratio: 2.6 {ratio} (ref 0.0–4.4)
Cholesterol, Total: 177 mg/dL (ref 100–199)
HDL: 67 mg/dL (ref >39)
LDL Chol Calc (NIH): 96 mg/dL (ref 0–99)
Triglycerides: 74 mg/dL (ref 0–149)
VLDL Cholesterol Cal: 14 mg/dL (ref 5–40)

## 2023-05-13 LAB — CMP14+EGFR
ALT: 13 [IU]/L (ref 0–32)
AST: 16 [IU]/L (ref 0–40)
Albumin: 3.7 g/dL — ABNORMAL LOW (ref 3.9–4.9)
Alkaline Phosphatase: 76 [IU]/L (ref 44–121)
BUN/Creatinine Ratio: 20 (ref 12–28)
BUN: 13 mg/dL (ref 8–27)
Bilirubin Total: 0.3 mg/dL (ref 0.0–1.2)
CO2: 24 mmol/L (ref 20–29)
Calcium: 9.3 mg/dL (ref 8.7–10.3)
Chloride: 102 mmol/L (ref 96–106)
Creatinine, Ser: 0.65 mg/dL (ref 0.57–1.00)
Globulin, Total: 2.4 g/dL (ref 1.5–4.5)
Glucose: 78 mg/dL (ref 70–99)
Potassium: 4.9 mmol/L (ref 3.5–5.2)
Sodium: 141 mmol/L (ref 134–144)
Total Protein: 6.1 g/dL (ref 6.0–8.5)
eGFR: 99 mL/min/{1.73_m2} (ref >59)

## 2023-05-13 LAB — HEMOGLOBIN A1C
Est. average glucose Bld gHb Est-mCnc: 100 mg/dL
Hgb A1c MFr Bld: 5.1 % (ref 4.8–5.6)

## 2023-05-13 LAB — TSH+FREE T4
Free T4: 1.25 ng/dL (ref 0.82–1.77)
TSH: 2.01 u[IU]/mL (ref 0.450–4.500)

## 2023-05-13 LAB — VITAMIN D 25 HYDROXY (VIT D DEFICIENCY, FRACTURES): Vit D, 25-Hydroxy: 34.9 ng/mL (ref 30.0–100.0)

## 2023-05-14 NOTE — Progress Notes (Signed)
Please inform the patient that her labs are stable

## 2023-05-15 ENCOUNTER — Encounter: Payer: Self-pay | Admitting: Family Medicine

## 2023-06-14 ENCOUNTER — Other Ambulatory Visit: Payer: Self-pay | Admitting: Family Medicine

## 2023-06-14 DIAGNOSIS — L750 Bromhidrosis: Secondary | ICD-10-CM

## 2023-06-23 ENCOUNTER — Other Ambulatory Visit: Payer: Self-pay | Admitting: Family Medicine

## 2023-06-26 ENCOUNTER — Ambulatory Visit: Payer: Medicare Other

## 2023-07-05 ENCOUNTER — Ambulatory Visit: Payer: Medicare Other

## 2023-07-05 VITALS — BP 120/80 | Ht <= 58 in | Wt 94.0 lb

## 2023-07-05 DIAGNOSIS — Z2821 Immunization not carried out because of patient refusal: Secondary | ICD-10-CM

## 2023-07-05 DIAGNOSIS — Z5320 Procedure and treatment not carried out because of patient's decision for unspecified reasons: Secondary | ICD-10-CM

## 2023-07-05 DIAGNOSIS — Z1211 Encounter for screening for malignant neoplasm of colon: Secondary | ICD-10-CM

## 2023-07-05 NOTE — Progress Notes (Signed)
 Subjective:   Tamara Curry is a 63 y.o. who presents for a Medicare Wellness preventive visit.  Visit Complete: Virtual I connected with  Tamara Curry on 07/05/23 by a audio enabled telemedicine application and verified that I am speaking with the correct person using two identifiers.  Patient Location: Home  Provider Location: Home Office  I discussed the limitations of evaluation and management by telemedicine. The patient expressed understanding and agreed to proceed.  Vital Signs: Because this visit was a virtual/telehealth visit, some criteria may be missing or patient reported. Any vitals not documented were not able to be obtained and vitals that have been documented are patient reported.  VideoDeclined- This patient declined Librarian, academic. Therefore the visit was completed with audio only.  Persons Participating in Visit: Patient assisted by her caregiver .  AWV Questionnaire: No: Patient Medicare AWV questionnaire was not completed prior to this visit.  Cardiac Risk Factors include: advanced age (>17men, >3 women)     Objective:    Today's Vitals   07/05/23 1103 07/05/23 1104  BP: 120/80   Weight: 94 lb (42.6 kg)   Height: 4\' 8"  (1.422 m)   PainSc:  0-No pain   Body mass index is 21.07 kg/m.     07/05/2023   10:59 AM 05/30/2022   11:46 AM 11/04/2021    6:35 PM 06/24/2021    8:43 PM 07/09/2014    3:10 PM 07/09/2014   12:00 PM 05/30/2014    1:15 PM  Advanced Directives  Does Patient Have a Medical Advance Directive? Yes Unable to assess, patient is non-responsive or altered mental status No No No No No  Type of Advance Directive Healthcare Power of [place];Living will        Does patient want to make changes to medical advance directive? No - Patient declined        Copy of Healthcare Power of Attorney in Chart? No - copy requested        Would patient like information on creating a medical advance directive?    No - Patient  declined  No - patient declined information No - patient declined information    Current Medications (verified) Outpatient Encounter Medications as of 07/05/2023  Medication Sig   ARIPiprazole (ABILIFY) 5 MG tablet    BANOPHEN 25 MG capsule TAKE 2 CAPSULES BY MOUTH AT BEDTIME   BENZOYL PEROXIDE 5 % external wash APPLY TOPICALLY TO THE AFFECTED AREAS 2 TIMES DAILY AS DIRECTED   citalopram (CELEXA) 20 MG tablet    clonazePAM (KLONOPIN) 0.5 MG tablet Take 0.5 mg by mouth at bedtime as needed.   diphenhydrAMINE (SOMINEX) 25 MG tablet Take 50 mg by mouth at bedtime.   feeding supplement (ENSURE ENLIVE / ENSURE PLUS) LIQD Take 237 mLs by mouth 2 (two) times daily between meals.   hydrOXYzine (ATARAX/VISTARIL) 10 MG tablet    ibuprofen (ADVIL) 200 MG tablet    lamoTRIgine (LAMICTAL) 25 MG tablet 1 tablet   Multiple Vitamin (MULTIVITAMIN) TABS TAKE 1 TABLET BY MOUTH ONCE DAILY   naltrexone (DEPADE) 50 MG tablet Take 50 mg by mouth daily.   traZODone (DESYREL) 50 MG tablet Take 50 mg by mouth 2 (two) times daily. 100 mg to sleep better   UNABLE TO FIND Med Name: Hosipital Bed G80.9   Calcium Carb-Cholecalciferol 600-10 MG-MCG TABS TAKE 2 TABLETS BY MOUTH ONCE DAILY WITH FOOD (Patient not taking: Reported on 07/05/2023)   doxycycline (VIBRA-TABS) 100 MG tablet Take 1  tablet (100 mg total) by mouth 2 (two) times daily. (Patient not taking: Reported on 07/05/2023)   doxycycline (VIBRAMYCIN) 100 MG capsule Take 1 capsule (100 mg total) by mouth 2 (two) times daily. (Patient not taking: Reported on 07/05/2023)   erythromycin with ethanol (EMGEL) 2 % gel Apply topically daily. (Patient not taking: Reported on 07/05/2023)   Miconazole Nitrate (ATHLETES FOOT POWDER SPRAY) 2 % AERP Spray between toes of both feet once daily (Patient not taking: Reported on 07/05/2023)   PROLIA 60 MG/ML SOSY injection INJECT 60 MG SUBCUTANEOUSLY EVERY 6 MONTHS (Patient not taking: Reported on 07/05/2023)   SHINGRIX injection   (Patient not taking: Reported on 07/05/2023)   No facility-administered encounter medications on file as of 07/05/2023.    Allergies (verified) Penicillin g and Penicillins   History: Past Medical History:  Diagnosis Date   Anxiety    Cerebral palsy (HCC)    Depression    Hearing loss    Sinus disorder    Past Surgical History:  Procedure Laterality Date   BRAIN SURGERY     Family History  Problem Relation Age of Onset   Breast cancer Neg Hx    Social History   Socioeconomic History   Marital status: Single    Spouse name: Not on file   Number of children: Not on file   Years of education: Not on file   Highest education level: Not on file  Occupational History   Not on file  Tobacco Use   Smoking status: Never   Smokeless tobacco: Never  Substance and Sexual Activity   Alcohol use: Yes    Alcohol/week: 1.0 standard drink of alcohol    Types: 1 Shots of liquor per week    Comment: occasionally   Drug use: No   Sexual activity: Not on file  Other Topics Concern   Not on file  Social History Narrative   Not on file   Social Drivers of Health   Financial Resource Strain: Low Risk  (07/05/2023)   Overall Financial Resource Strain (CARDIA)    Difficulty of Paying Living Expenses: Not hard at all  Food Insecurity: No Food Insecurity (07/05/2023)   Hunger Vital Sign    Worried About Running Out of Food in the Last Year: Never true    Ran Out of Food in the Last Year: Never true  Transportation Needs: No Transportation Needs (07/05/2023)   PRAPARE - Administrator, Civil Service (Medical): No    Lack of Transportation (Non-Medical): No  Physical Activity: Insufficiently Active (07/05/2023)   Exercise Vital Sign    Days of Exercise per Week: 1 day    Minutes of Exercise per Session: 10 min  Stress: No Stress Concern Present (07/05/2023)   [employer] of Occupational Health - Occupational Stress Questionnaire    Feeling of Stress : Only a little   Social Connections: Unknown (07/05/2023)   Social Connection and Isolation Panel [NHANES]    Frequency of Communication with Friends and Family: More than three times a week    Frequency of Social Gatherings with Friends and Family: More than three times a week    Attends Religious Services: Never    Database administrator or Organizations: No    Attends Engineer, structural: Never    Marital Status: Patient declined    Tobacco Counseling Counseling given: Not Answered    Clinical Intake:  Pre-visit preparation completed: Yes  Pain : No/denies pain Pain Score: 0-No pain  BMI - recorded: 21.07 Nutritional Status: BMI of 19-24  Normal Nutritional Risks: None Diabetes: No  Lab Results  Component Value Date   HGBA1C 5.1 05/12/2023   HGBA1C 5.2 07/20/2022     How often do you need to have someone help you when you read instructions, pamphlets, or other written materials from your doctor or pharmacy?: 5 - Always  Interpreter Needed?: No  Information entered by :: Estell Harpin   Activities of Daily Living     07/05/2023   11:15 AM  In your present state of health, do you have any difficulty performing the following activities:  Hearing? 1  Vision? 0  Difficulty concentrating or making decisions? 1  Comment disability  Walking or climbing stairs? 1  Dressing or bathing? 1  Comment care giver  Doing errands, shopping? 1  Comment caregiver  Preparing Food and eating ? Y  Using the Toilet? N  In the past six months, have you accidently leaked urine? Y  Comment alot of time  Do you have problems with loss of bowel control? Y  Comment yes  Managing your Medications? Y  Comment caregiver  Managing your Finances? Y  Housekeeping or managing your Housekeeping? Y    Patient Care Team: Gilmore Laroche, FNP as PCP - General (Family Medicine)  Indicate any recent Medical Services you may have received from other than [facility] providers in the past  year (date may be approximate).     Assessment:   This is a routine wellness examination for Brandii.  Hearing/Vision screen Hearing Screening - Comments:: Yes hearing issues pt refuses hearing aids Vision Screening - Comments:: No hearing issues  no current eye doctor   Goals Addressed             This Visit's Progress    Patient Stated       Get alone with people       Depression Screen     07/05/2023   11:20 AM 10/04/2022    4:17 PM 05/30/2022   11:47 AM 05/30/2022   11:46 AM 05/23/2022    4:28 PM  PHQ 2/9 Scores  PHQ - 2 Score 0 0 0 0 0  PHQ- 9 Score 0 0   0    Fall Risk     07/05/2023   11:14 AM 05/08/2023    1:37 PM 10/04/2022    4:28 PM 10/04/2022    4:17 PM 05/30/2022   11:46 AM  Fall Risk   Falls in the past year? 0 0 0 0 Exclusion - non ambulatory  Number falls in past yr: 0 0 0 0   Injury with Fall? 0  0 0   Risk for fall due to : Impaired balance/gait;Impaired mobility;Other (Comment) Impaired balance/gait;Impaired mobility;Other (Comment) No Fall Risks No Fall Risks   Risk for fall due to: Comment  disability/ age     Follow up Follow up appointment Follow up appointment Falls evaluation completed Falls evaluation completed     MEDICARE RISK AT HOME:  Medicare Risk at Home Any stairs in or around the home?: No If so, are there any without handrails?: No Home free of loose throw rugs in walkways, pet beds, electrical cords, etc?: Yes Adequate lighting in your home to reduce risk of falls?: Yes Life alert?: No Use of a cane, walker or w/c?: Yes (wheelchair) Grab bars in the bathroom?: Yes Shower chair or bench in shower?: Yes Elevated toilet seat or a handicapped toilet?: Yes  TIMED UP AND  GO:  Was the test performed?  no  Cognitive Function: 6CIT completed    05/30/2022   11:48 AM  MMSE - Mini Mental State Exam  Not completed: Unable to complete        07/05/2023   11:08 AM  6CIT Screen  What Year? --  What month? 0 points  What time? 0  points  Count back from 20 0 points  Months in reverse 4 points  Repeat phrase 4 points    Immunizations Immunization History  Administered Date(s) Administered   Influenza, Seasonal, Injecte, Preservative Fre 03/01/2023   Influenza,inj,Quad PF,6+ Mos 03/23/2018   Tdap 10/04/2022   Zoster Recombinant(Shingrix) 06/07/2018, 06/07/2018, 09/05/2018, 09/05/2018    Screening Tests Health Maintenance  Topic Date Due   Cervical Cancer Screening (HPV/Pap Cotest)  Never done   Colonoscopy  Never done   COVID-19 Vaccine (1 - 2024-25 season) Never done   Medicare Annual Wellness (AWV)  05/31/2023   MAMMOGRAM  08/19/2023   DTaP/Tdap/Td (2 - Td or Tdap) 10/03/2032   INFLUENZA VACCINE  Completed   Hepatitis C Screening  Completed   HIV Screening  Completed   Zoster Vaccines- Shingrix  Completed   HPV VACCINES  Aged Out    Health Maintenance  Health Maintenance Due  Topic Date Due   Cervical Cancer Screening (HPV/Pap Cotest)  Never done   Colonoscopy  Never done   COVID-19 Vaccine (1 - 2024-25 season) Never done   Medicare Annual Wellness (AWV)  05/31/2023   Health Maintenance Items Addressed: Cologuard Ordered  Additional Screening:  Vision Screening: Recommended annual ophthalmology exams for early detection of glaucoma and other disorders of the eye.  Dental Screening: Recommended annual dental exams for proper oral hygiene  Community Resource Referral / Chronic Care Management: CRR required this visit?  No   CCM required this visit?  No     Plan:     I have personally reviewed and noted the following in the patient's chart:   Medical and social history Use of alcohol, tobacco or illicit drugs  Current medications and supplements including opioid prescriptions. Patient is not currently taking opioid prescriptions. Functional ability and status Nutritional status Physical activity Advanced directives List of other physicians Hospitalizations, surgeries, and ER  visits in previous 12 months Vitals Screenings to include cognitive, depression, and falls Referrals and appointments  In addition, I have reviewed and discussed with patient certain preventive protocols, quality metrics, and best practice recommendations. A written personalized care plan for preventive services as well as general preventive health recommendations were provided to patient.     Rudi Heap, [place]   07/05/2023   After Visit Summary: (MyChart) Due to this being a telephonic visit, the after visit summary with patients personalized plan was offered to patient via MyChart   Notes: Nothing significant to report at this time.

## 2023-07-05 NOTE — Patient Instructions (Signed)
 Tamara Curry , Thank you for taking time to come for your Medicare Wellness Visit. I appreciate your ongoing commitment to your health goals. Please review the following plan we discussed and let me know if I can assist you in the future.   Referrals/Orders/Follow-Ups/Clinician Recommendations: patient colonoscopy was ordered . Pap and vaccines declined  This is a list of the screening recommended for you and due dates:  Health Maintenance  Topic Date Due   Pap with HPV screening  Never done   Colon Cancer Screening  Never done   COVID-19 Vaccine (1 - 2024-25 season) Never done   Medicare Annual Wellness Visit  05/31/2023   Mammogram  08/19/2023   DTaP/Tdap/Td vaccine (2 - Td or Tdap) 10/03/2032   Flu Shot  Completed   Hepatitis C Screening  Completed   HIV Screening  Completed   Zoster (Shingles) Vaccine  Completed   HPV Vaccine  Aged Out    Advanced directives: (Declined) Advance directive discussed with you today. Even though you declined this today, please call our office should you change your mind, and we can give you the proper paperwork for you to fill out.  Next Medicare Annual Wellness Visit scheduled for next year: Yes

## 2023-07-06 ENCOUNTER — Encounter: Payer: Self-pay | Admitting: *Deleted

## 2023-07-20 ENCOUNTER — Other Ambulatory Visit: Payer: Self-pay | Admitting: Family Medicine

## 2023-07-25 ENCOUNTER — Ambulatory Visit (HOSPITAL_COMMUNITY)
Admission: RE | Admit: 2023-07-25 | Discharge: 2023-07-25 | Disposition: A | Discharge status: Home / Self Care | Source: Ambulatory Visit | Attending: Family Medicine | Admitting: Family Medicine

## 2023-07-25 DIAGNOSIS — M810 Age-related osteoporosis without current pathological fracture: Secondary | ICD-10-CM | POA: Diagnosis not present

## 2023-07-25 DIAGNOSIS — Z780 Asymptomatic menopausal state: Secondary | ICD-10-CM | POA: Diagnosis not present

## 2023-07-25 DIAGNOSIS — M818 Other osteoporosis without current pathological fracture: Secondary | ICD-10-CM | POA: Diagnosis not present

## 2023-08-09 NOTE — Progress Notes (Signed)
 Please inform the patient that her dexa scan indicated that she has osteoporosis.  We will repeat screening  every 2 years.   Please continue treatment on prolia  60 mg/ml every 6 months

## 2023-08-14 ENCOUNTER — Other Ambulatory Visit: Payer: Self-pay | Admitting: Family Medicine

## 2023-08-14 DIAGNOSIS — M818 Other osteoporosis without current pathological fracture: Secondary | ICD-10-CM

## 2023-08-14 MED ORDER — DENOSUMAB 60 MG/ML ~~LOC~~ SOSY: 60.0000 mg | PREFILLED_SYRINGE | Freq: Once | SUBCUTANEOUS | Status: DC

## 2023-08-14 NOTE — Progress Notes (Signed)
 Yes she can.

## 2023-08-16 ENCOUNTER — Telehealth: Payer: Self-pay

## 2023-08-16 ENCOUNTER — Other Ambulatory Visit: Payer: Self-pay | Admitting: Family Medicine

## 2023-08-16 NOTE — Telephone Encounter (Signed)
 Pts care giver brittany davis aware and had verbal understanding

## 2023-08-16 NOTE — Telephone Encounter (Signed)
 Copied from CRM [id number]. Topic: General - Other >> Aug 16, 2023  9:56 AM Felizardo Hotter wrote: Reason for CRM: Pt's care giver Azalea Bolder ph: [phone] called regarding pt's Prolia  Shot for 09/04/2023. Please call Azalea Bolder.

## 2023-08-28 ENCOUNTER — Other Ambulatory Visit (HOSPITAL_COMMUNITY): Payer: Self-pay

## 2023-08-28 ENCOUNTER — Telehealth: Payer: Self-pay

## 2023-08-28 NOTE — Telephone Encounter (Signed)
 Pharmacy Patient Advocate Encounter  Insurance verification completed.    The patient is insured through [employer] claim for [employer]. Currently a quantity of is a 180 day supply and the co-pay is $0 .   This test claim was processed through [facility]- copay amounts may vary at other pharmacies due to pharmacy/plan contracts, or as the patient moves through the different stages of their insurance plan.

## 2023-08-28 NOTE — Telephone Encounter (Signed)
 Prolia  VOB initiated via [email or web address]  Next Prolia  inj DUE: 09/04/23

## 2023-08-30 ENCOUNTER — Other Ambulatory Visit (HOSPITAL_COMMUNITY): Payer: Self-pay

## 2023-09-04 ENCOUNTER — Encounter: Payer: Self-pay | Admitting: Family Medicine

## 2023-09-04 ENCOUNTER — Ambulatory Visit (INDEPENDENT_AMBULATORY_CARE_PROVIDER_SITE_OTHER): Payer: Medicare Other | Admitting: Family Medicine

## 2023-09-04 VITALS — BP 130/80 | HR 58 | Resp 16 | Ht <= 58 in | Wt 89.0 lb

## 2023-09-04 DIAGNOSIS — R058 Other specified cough: Secondary | ICD-10-CM | POA: Diagnosis not present

## 2023-09-04 DIAGNOSIS — M818 Other osteoporosis without current pathological fracture: Secondary | ICD-10-CM

## 2023-09-04 DIAGNOSIS — Z1211 Encounter for screening for malignant neoplasm of colon: Secondary | ICD-10-CM

## 2023-09-04 DIAGNOSIS — B353 Tinea pedis: Secondary | ICD-10-CM

## 2023-09-04 DIAGNOSIS — K117 Disturbances of salivary secretion: Secondary | ICD-10-CM | POA: Diagnosis not present

## 2023-09-04 DIAGNOSIS — G808 Other cerebral palsy: Secondary | ICD-10-CM | POA: Diagnosis not present

## 2023-09-04 MED ORDER — UNABLE TO FIND: 3 refills | Status: AC

## 2023-09-04 MED ORDER — PROMETHAZINE-DM 6.25-15 MG/5ML PO SYRP: 5.0000 mL | ORAL_SOLUTION | Freq: Four times a day (QID) | ORAL | 0 refills | Status: DC | PRN

## 2023-09-04 MED ORDER — ATHLETES FOOT POWDER SPRAY 2 % EX AERP: INHALATION_SPRAY | CUTANEOUS | 4 refills | Status: DC

## 2023-09-04 MED ORDER — DENOSUMAB 60 MG/ML ~~LOC~~ SOSY
60.0000 mg | PREFILLED_SYRINGE | Freq: Once | SUBCUTANEOUS | 0 refills | Status: DC
Start: 2023-09-04 — End: 2023-09-04

## 2023-09-04 MED ORDER — ATHLETES FOOT POWDER SPRAY 2 % EX AERP: INHALATION_SPRAY | CUTANEOUS | 4 refills | Status: AC

## 2023-09-04 MED ORDER — UNABLE TO FIND: 3 refills | Status: DC

## 2023-09-04 MED ORDER — DENOSUMAB 60 MG/ML ~~LOC~~ SOSY: 60.0000 mg | PREFILLED_SYRINGE | Freq: Once | SUBCUTANEOUS | 0 refills | Status: AC

## 2023-09-04 NOTE — Telephone Encounter (Signed)
 Copied from CRM [id number]. Topic: Clinical - Prescription Issue >> Sep 04, 2023 12:29 PM Emylou G wrote: Reason for CRM: Patient just left there..Please make sure all meds accept Prolia  gets sent to [facility], [place] - [address] [place] [address] Phone: [phone] Fax: [phone] Hours: Not open 24 hours

## 2023-09-04 NOTE — Progress Notes (Signed)
 Established Patient Office Visit  Subjective:  Patient ID: Tamara Curry, female    DOB: 1961-01-30  Age: 63 y.o. MRN: [id number]  CC:  Chief Complaint  Patient presents with   Cough    Has had a cough for 1-2 weeks, harsh cough, Producing some mucus, Gets worse in the pm and overnight when she lays down.     HPI Tamara Curry is a 63 y.o. female with past medical history of Cerebral palsy, drooling, cough, dysphagia presents for f/u of  chronic medical conditions.  For the details of today's visit, please refer to the assessment and plan.    Cough: The patient's caregiver reports an increase in coughing over the past one to two weeks, accompanied by some mucus production. She also notes that the patient has difficulty swallowing liquids but is able to swallow pudding more easily. No upper respiratory symptoms are reported. No systemic symptoms noted.   Past Medical History:  Diagnosis Date   Anxiety    Cerebral palsy (HCC)    Depression    Hearing loss    Sinus disorder     Past Surgical History:  Procedure Laterality Date   BRAIN SURGERY      Family History  Problem Relation Age of Onset   Breast cancer Neg Hx     Social History   Socioeconomic History   Marital status: Single    Spouse name: Not on file   Number of children: Not on file   Years of education: Not on file   Highest education level: Not on file  Occupational History   Not on file  Tobacco Use   Smoking status: Never   Smokeless tobacco: Never  Substance and Sexual Activity   Alcohol use: Yes    Alcohol/week: 1.0 standard drink of alcohol    Types: 1 Shots of liquor per week    Comment: occasionally   Drug use: No   Sexual activity: Not on file  Other Topics Concern   Not on file  Social History Narrative   Not on file   Social Drivers of Health   Financial Resource Strain: Low Risk  (07/05/2023)   Overall Financial Resource Strain (CARDIA)    Difficulty of Paying Living Expenses:  Not hard at all  Food Insecurity: No Food Insecurity (07/05/2023)   Hunger Vital Sign    Worried About Running Out of Food in the Last Year: Never true    Ran Out of Food in the Last Year: Never true  Transportation Needs: No Transportation Needs (07/05/2023)   PRAPARE - Administrator, Civil Service (Medical): No    Lack of Transportation (Non-Medical): No  Physical Activity: Insufficiently Active (07/05/2023)   Exercise Vital Sign    Days of Exercise per Week: 1 day    Minutes of Exercise per Session: 10 min  Stress: No Stress Concern Present (07/05/2023)   [employer] of Occupational Health - Occupational Stress Questionnaire    Feeling of Stress : Only a little  Social Connections: Unknown (07/05/2023)   Social Connection and Isolation Panel [NHANES]    Frequency of Communication with Friends and Family: More than three times a week    Frequency of Social Gatherings with Friends and Family: More than three times a week    Attends Religious Services: Never    Database administrator or Organizations: No    Attends Banker Meetings: Never    Marital Status: Patient declined  Intimate  Partner Violence: Not At Risk (07/05/2023)   Humiliation, Afraid, Rape, and Kick questionnaire    Fear of Current or Ex-Partner: No    Emotionally Abused: No    Physically Abused: No    Sexually Abused: No    Outpatient Medications Prior to Visit  Medication Sig Dispense Refill   ARIPiprazole (ABILIFY) 5 MG tablet      BENZOYL PEROXIDE  5 % external wash APPLY TOPICALLY TO THE AFFECTED AREAS 2 TIMES DAILY AS DIRECTED 148 g 5   citalopram (CELEXA) 20 MG tablet      clonazePAM (KLONOPIN) 0.5 MG tablet Take 0.5 mg by mouth at bedtime as needed.     diphenhydrAMINE  (BENADRYL ) 25 mg capsule TAKE 2 CAPSULES BY MOUTH AT BEDTIME 60 capsule 1   diphenhydrAMINE  (SOMINEX) 25 MG tablet Take 50 mg by mouth at bedtime.     feeding supplement (ENSURE ENLIVE / ENSURE PLUS) LIQD Take 237  mLs by mouth 2 (two) times daily between meals. 237 mL 12   hydrOXYzine (ATARAX/VISTARIL) 10 MG tablet      ibuprofen  (ADVIL ) 200 MG tablet      lamoTRIgine (LAMICTAL) 25 MG tablet 1 tablet     Multiple Vitamin (MULTIVITAMIN) TABS TAKE 1 TABLET BY MOUTH ONCE DAILY 28 tablet 2   naltrexone (DEPADE) 50 MG tablet Take 50 mg by mouth daily.     PROLIA  60 MG/ML SOSY injection INJECT 60 MG SUBCUTANEOUSLY EVERY 6 MONTHS 1 mL 4   traZODone (DESYREL) 50 MG tablet Take 50 mg by mouth 2 (two) times daily. 100 mg to sleep better     UNABLE TO FIND Med Name: Hosipital Bed G80.9 1 each 0   Miconazole Nitrate (ATHLETES FOOT POWDER SPRAY) 2 % AERP Spray between toes of both feet once daily 130 g 4   Calcium Carb-Cholecalciferol 600-10 MG-MCG TABS TAKE 2 TABLETS BY MOUTH ONCE DAILY WITH FOOD (Patient not taking: Reported on 07/05/2023) 60 tablet 4   doxycycline  (VIBRA -TABS) 100 MG tablet Take 1 tablet (100 mg total) by mouth 2 (two) times daily. (Patient not taking: Reported on 07/05/2023) 20 tablet 0   doxycycline  (VIBRAMYCIN ) 100 MG capsule Take 1 capsule (100 mg total) by mouth 2 (two) times daily. (Patient not taking: Reported on 07/05/2023) 20 capsule 0   erythromycin  with ethanol (EMGEL ) 2 % gel Apply topically daily. (Patient not taking: Reported on 07/05/2023) 30 g 0   SHINGRIX injection  (Patient not taking: Reported on 07/05/2023)     denosumab  (PROLIA ) injection 60 mg      No facility-administered medications prior to visit.    Allergies  Allergen Reactions   Penicillin G     Other reaction(s): as a child   Penicillins Other (See Comments)    Hot feeling in mouth    ROS Review of Systems  Constitutional:  Negative for chills and fever.  Eyes:  Negative for visual disturbance.  Respiratory:  Positive for cough. Negative for chest tightness and shortness of breath.   Neurological:  Negative for dizziness and headaches.      Objective:     Physical Exam HENT:     Head: Normocephalic.      Mouth/Throat:     Mouth: Mucous membranes are moist.  Cardiovascular:     Rate and Rhythm: Normal rate.     Heart sounds: Normal heart sounds.  Pulmonary:     Effort: Pulmonary effort is normal.     Breath sounds: Normal breath sounds.  Neurological:  Mental Status: She is alert.     BP 130/80   Pulse (!) 58   Resp 16   Ht 4\' 8"  (1.422 m)   Wt 89 lb (40.4 kg)   SpO2 93%   BMI 19.95 kg/m  Wt Readings from Last 3 Encounters:  09/04/23 89 lb (40.4 kg)  07/05/23 94 lb (42.6 kg)  05/08/23 93 lb 0.6 oz (42.2 kg)    Lab Results  Component Value Date   TSH 2.010 05/12/2023   Lab Results  Component Value Date   WBC 6.1 05/12/2023   HGB 12.4 05/12/2023   HCT 37.5 05/12/2023   MCV 95 05/12/2023   PLT 299 05/12/2023   Lab Results  Component Value Date   NA 141 05/12/2023   K 4.9 05/12/2023   CO2 24 05/12/2023   GLUCOSE 78 05/12/2023   BUN 13 05/12/2023   CREATININE 0.65 05/12/2023   BILITOT 0.3 05/12/2023   ALKPHOS 76 05/12/2023   AST 16 05/12/2023   ALT 13 05/12/2023   PROT 6.1 05/12/2023   ALBUMIN 3.7 (L) 05/12/2023   CALCIUM 9.3 05/12/2023   ANIONGAP 10 11/23/2014   EGFR 99 05/12/2023   Lab Results  Component Value Date   CHOL 177 05/12/2023   Lab Results  Component Value Date   HDL 67 05/12/2023   Lab Results  Component Value Date   LDLCALC 96 05/12/2023   Lab Results  Component Value Date   TRIG 74 05/12/2023   Lab Results  Component Value Date   CHOLHDL 2.6 05/12/2023   Lab Results  Component Value Date   HGBA1C 5.1 05/12/2023      Assessment & Plan:  Drooling Assessment & Plan: Referral placed for a swallow test Encouraged to use food thickener to help achieve a pudding like consistency  Orders: -     SLP modified barium swallow -     UNABLE TO FIND; Med Name: Food thicker (Thick-It) about 2-3 tablespoons of powder to help achieve a pudding like consistency ICD: K11.7  Dispense: 1 each; Refill: 3  Other cough Assessment &  Plan: Will treat with promethazine -DM for cough  Orders: -     SLP modified barium swallow -     Promethazine -DM; Take 5 mLs by mouth 4 (four) times daily as needed.  Dispense: 118 mL; Refill: 0 -     UNABLE TO FIND; Med Name: Food thicker (Thick-It) about 2-3 tablespoons of powder to help achieve a pudding like consistency ICD: K11.7  Dispense: 1 each; Refill: 3  Tinea pedis of both feet -     Athletes Foot Powder Spray; Spray between toes of both feet once daily  Dispense: 130 g; Refill: 4  Other cerebral palsy (HCC) -     UNABLE TO FIND; Med Name: Food thicker (Thick-It) about 2-3 tablespoons of powder to help achieve a pudding like consistency ICD: K11.7  Dispense: 1 each; Refill: 3  Other osteoporosis without current pathological fracture -     Denosumab ; Inject 60 mg into the skin once for 1 dose.  Dispense: 1 mL; Refill: 0  Colon cancer screening -     Cologuard  Note: This chart has been completed using Engineer, civil (consulting) software, and while attempts have been made to ensure accuracy, certain words and phrases may not be transcribed as intended.    Follow-up: Return in about 5 months (around 02/04/2024).   Josie Burleigh, FNP

## 2023-09-04 NOTE — Patient Instructions (Addendum)
 I appreciate the opportunity to provide care to you today!    Follow up:  5 months   Referrals today-  swallow test  Please continue to a heart-healthy diet and increase your physical activities. Try to exercise for at least five days a week.    It was a pleasure to see you and I look forward to continuing to work together on your health and well-being. Please do not hesitate to call the office if you need care or have questions about your care.  In case of emergency, please visit the Emergency Department for urgent care, or contact our clinic at [phone] to schedule an appointment. We're here to help you!   Have a wonderful day and week. With Gratitude, Aine Strycharz MSN, FNP-BC

## 2023-09-05 NOTE — Telephone Encounter (Signed)
 Message just said send everything to [facility] except Prolia . Where would they like the prolia  script sent to?

## 2023-09-05 NOTE — Telephone Encounter (Addendum)
 FYI--please advise.

## 2023-09-07 DIAGNOSIS — R059 Cough, unspecified: Secondary | ICD-10-CM | POA: Insufficient documentation

## 2023-09-07 NOTE — Assessment & Plan Note (Addendum)
 Will treat with promethazine -DM for cough

## 2023-09-07 NOTE — Assessment & Plan Note (Signed)
 Referral placed for a swallow test Encouraged to use food thickener to help achieve a pudding like consistency

## 2023-09-12 ENCOUNTER — Telehealth: Payer: Self-pay

## 2023-09-12 NOTE — Telephone Encounter (Signed)
 Copied from CRM [id number]. Topic: Clinical - Prescription Issue >> Sep 12, 2023  2:52 PM Chrystal Crape R wrote: Please send pt promethazine -dextromethorphan (PROMETHAZINE -DM) 6.25-15 MG/5ML syrup and PROLIA  60 MG/ML SOSY injection to [facility] [place], [place] - [address] Phone: [phone]. Will cal in to schedule nurse visit for administration of the shot once they receive it.

## 2023-09-12 NOTE — Telephone Encounter (Signed)
 Left vm asking patient to call back informing us  what pharmacy the prolia  needs to go to

## 2023-09-13 ENCOUNTER — Other Ambulatory Visit: Payer: Self-pay

## 2023-09-13 DIAGNOSIS — R058 Other specified cough: Secondary | ICD-10-CM

## 2023-09-13 MED ORDER — PROLIA 60 MG/ML ~~LOC~~ SOSY: 60.0000 mg | PREFILLED_SYRINGE | SUBCUTANEOUS | 4 refills | Status: DC

## 2023-09-13 MED ORDER — PROMETHAZINE-DM 6.25-15 MG/5ML PO SYRP: 5.0000 mL | ORAL_SOLUTION | Freq: Four times a day (QID) | ORAL | 0 refills | Status: AC | PRN

## 2023-09-13 NOTE — Telephone Encounter (Signed)
 Medication sent to pharmacy

## 2023-10-11 ENCOUNTER — Other Ambulatory Visit: Payer: Self-pay | Admitting: Family Medicine

## 2023-10-23 ENCOUNTER — Telehealth: Payer: Self-pay

## 2023-10-23 NOTE — Telephone Encounter (Signed)
 Copied from CRM [id number]. Topic: Clinical - Prescription Issue >> Oct 23, 2023  1:31 PM Tobias L wrote: Reason for CRM: [place] states the rx for thick it is too small. Patient running through faster as the container is too small. [place] states patient is needing bigger container.   Please assist further  Best call back number: [phone]

## 2023-10-27 NOTE — Telephone Encounter (Signed)
 Kindly verify with the pharmacy the largest available size of Thick-It

## 2023-11-10 ENCOUNTER — Other Ambulatory Visit: Payer: Self-pay | Admitting: Family Medicine

## 2023-12-06 ENCOUNTER — Other Ambulatory Visit: Payer: Self-pay | Admitting: Family Medicine

## 2024-01-03 ENCOUNTER — Other Ambulatory Visit: Payer: Self-pay | Admitting: Family Medicine

## 2024-01-09 ENCOUNTER — Encounter: Payer: Self-pay | Admitting: *Deleted

## 2024-01-15 DIAGNOSIS — H2513 Age-related nuclear cataract, bilateral: Secondary | ICD-10-CM | POA: Diagnosis not present

## 2024-01-15 DIAGNOSIS — H5203 Hypermetropia, bilateral: Secondary | ICD-10-CM | POA: Diagnosis not present

## 2024-01-15 DIAGNOSIS — H524 Presbyopia: Secondary | ICD-10-CM | POA: Diagnosis not present

## 2024-02-01 ENCOUNTER — Other Ambulatory Visit: Payer: Self-pay | Admitting: Family Medicine

## 2024-02-05 DIAGNOSIS — Z23 Encounter for immunization: Secondary | ICD-10-CM | POA: Diagnosis not present

## 2024-02-12 ENCOUNTER — Other Ambulatory Visit: Payer: Self-pay | Admitting: Family Medicine

## 2024-03-05 DIAGNOSIS — F341 Dysthymic disorder: Secondary | ICD-10-CM | POA: Diagnosis not present

## 2024-03-05 DIAGNOSIS — F410 Panic disorder [episodic paroxysmal anxiety] without agoraphobia: Secondary | ICD-10-CM | POA: Diagnosis not present

## 2024-03-07 ENCOUNTER — Encounter: Payer: Self-pay | Admitting: Family Medicine

## 2024-03-07 ENCOUNTER — Ambulatory Visit: Admitting: Family Medicine

## 2024-03-07 VITALS — BP 122/60 | HR 59

## 2024-03-07 DIAGNOSIS — G809 Cerebral palsy, unspecified: Secondary | ICD-10-CM | POA: Diagnosis not present

## 2024-03-07 DIAGNOSIS — M818 Other osteoporosis without current pathological fracture: Secondary | ICD-10-CM | POA: Diagnosis not present

## 2024-03-07 DIAGNOSIS — F419 Anxiety disorder, unspecified: Secondary | ICD-10-CM

## 2024-03-07 DIAGNOSIS — G808 Other cerebral palsy: Secondary | ICD-10-CM

## 2024-03-07 MED ORDER — PROLIA 60 MG/ML ~~LOC~~ SOSY: 60.0000 mg | PREFILLED_SYRINGE | SUBCUTANEOUS | 4 refills | Status: AC

## 2024-03-07 MED ORDER — PROLIA 60 MG/ML ~~LOC~~ SOSY: 60.0000 mg | PREFILLED_SYRINGE | SUBCUTANEOUS | 4 refills | Status: DC

## 2024-03-07 MED ORDER — UNABLE TO FIND
0 refills | Status: AC
Start: 2024-03-07 — End: ?

## 2024-03-07 NOTE — Progress Notes (Signed)
 Established Patient Office Visit  Subjective:  Patient ID: Tamara Curry, female    DOB: [birth year]  Age: 63 y.o. MRN: [id number]  CC:  Chief Complaint  Patient presents with   DME    Rx for briefs size medium and transport chair   Medical Management of Chronic Issues    Five month follow up    HPI Tamara Curry is a 63 y.o. female with past medical history of cerebral palsy, anxiety presents for f/u of  chronic medical conditions.  For the details of today's visit, please refer to the assessment and plan.     Past Medical History:  Diagnosis Date   Anxiety    Cerebral palsy (HCC)    Depression    Hearing loss    Sinus disorder     Past Surgical History:  Procedure Laterality Date   BRAIN SURGERY      Family History  Problem Relation Age of Onset   Breast cancer Neg Hx     Social History   Socioeconomic History   Marital status: Single    Spouse name: Not on file   Number of children: Not on file   Years of education: Not on file   Highest education level: Not on file  Occupational History   Not on file  Tobacco Use   Smoking status: Never   Smokeless tobacco: Never  Substance and Sexual Activity   Alcohol use: Yes    Alcohol/week: 1.0 standard drink of alcohol    Types: 1 Shots of liquor per week    Comment: occasionally   Drug use: No   Sexual activity: Not on file  Other Topics Concern   Not on file  Social History Narrative   Not on file   Social Drivers of Health   Financial Resource Strain: Low Risk  (07/05/2023)   Overall Financial Resource Strain (CARDIA)    Difficulty of Paying Living Expenses: Not hard at all  Food Insecurity: No Food Insecurity (07/05/2023)   Hunger Vital Sign    Worried About Running Out of Food in the Last Year: Never true    Ran Out of Food in the Last Year: Never true  Transportation Needs: No Transportation Needs (07/05/2023)   PRAPARE - Administrator, Civil Service (Medical): No    Lack of  Transportation (Non-Medical): No  Physical Activity: Insufficiently Active (07/05/2023)   Exercise Vital Sign    Days of Exercise per Week: 1 day    Minutes of Exercise per Session: 10 min  Stress: No Stress Concern Present (07/05/2023)   [employer] of Occupational Health - Occupational Stress Questionnaire    Feeling of Stress : Only a little  Social Connections: Unknown (07/05/2023)   Social Connection and Isolation Panel    Frequency of Communication with Friends and Family: More than three times a week    Frequency of Social Gatherings with Friends and Family: More than three times a week    Attends Religious Services: Never    Database Administrator or Organizations: No    Attends Banker Meetings: Never    Marital Status: Patient declined  Intimate Partner Violence: Not At Risk (07/05/2023)   Humiliation, Afraid, Rape, and Kick questionnaire    Fear of Current or Ex-Partner: No    Emotionally Abused: No    Physically Abused: No    Sexually Abused: No    Outpatient Medications Prior to Visit  Medication Sig Dispense Refill  ARIPiprazole (ABILIFY) 5 MG tablet      BANOPHEN  25 MG capsule TAKE 2 CAPSULES BY MOUTH AT BEDTIME 60 capsule 1   BENZOYL PEROXIDE  5 % external wash APPLY TOPICALLY TO THE AFFECTED AREAS 2 TIMES DAILY AS DIRECTED 148 g 5   citalopram (CELEXA) 20 MG tablet      clonazePAM (KLONOPIN) 0.5 MG tablet Take 0.5 mg by mouth at bedtime as needed.     diphenhydrAMINE  (SOMINEX) 25 MG tablet Take 50 mg by mouth at bedtime.     feeding supplement (ENSURE ENLIVE / ENSURE PLUS) LIQD Take 237 mLs by mouth 2 (two) times daily between meals. 237 mL 12   hydrOXYzine (ATARAX/VISTARIL) 10 MG tablet      ibuprofen  (ADVIL ) 200 MG tablet      lamoTRIgine (LAMICTAL) 25 MG tablet 1 tablet     Miconazole Nitrate (ATHLETES FOOT POWDER SPRAY) 2 % AERP Spray between toes of both feet once daily 130 g 4   Multiple Vitamin (MULTIVITAMIN) TABS TAKE 1 TABLET BY MOUTH  EVERY DAY 28 tablet 2   naltrexone (DEPADE) 50 MG tablet Take 50 mg by mouth daily.     promethazine -dextromethorphan (PROMETHAZINE -DM) 6.25-15 MG/5ML syrup Take 5 mLs by mouth 4 (four) times daily as needed. 118 mL 0   THICK-IT POWD USE 2-3 TABLESPOONS OF POWDER AS DIRECTED TO HELP ACHIEVE A PUDDLING LIKE CONSISTENCY 284 g 3   traZODone (DESYREL) 50 MG tablet Take 50 mg by mouth 2 (two) times daily. 100 mg to sleep better     UNABLE TO FIND Med Name: Hosipital Bed G80.9 1 each 0   UNABLE TO FIND Med Name: Food thicker (Thick-It) about 2-3 tablespoons of powder to help achieve a pudding like consistency ICD: K11.7 1 each 3   denosumab  (PROLIA ) 60 MG/ML SOSY injection Inject 60 mg into the skin every 6 (six) months. 1 mL 4   No facility-administered medications prior to visit.    Allergies  Allergen Reactions   Penicillin G     Other reaction(s): as a child   Penicillins Other (See Comments)    Hot feeling in mouth    ROS Review of Systems  Constitutional:  Negative for chills and fever.  Eyes:  Negative for visual disturbance.  Respiratory:  Negative for chest tightness and shortness of breath.   Neurological:  Negative for dizziness and headaches.      Objective:    Physical Exam HENT:     Head: Normocephalic.     Mouth/Throat:     Mouth: Mucous membranes are moist.  Cardiovascular:     Rate and Rhythm: Normal rate.     Heart sounds: Normal heart sounds.  Pulmonary:     Effort: Pulmonary effort is normal.     Breath sounds: Normal breath sounds.  Neurological:     Mental Status: She is alert.     BP 122/60   Pulse (!) 59   SpO2 98%  Wt Readings from Last 3 Encounters:  09/04/23 89 lb (40.4 kg)  07/05/23 94 lb (42.6 kg)  05/08/23 93 lb 0.6 oz (42.2 kg)    Lab Results  Component Value Date   TSH 2.010 05/12/2023   Lab Results  Component Value Date   WBC 6.1 05/12/2023   HGB 12.4 05/12/2023   HCT 37.5 05/12/2023   MCV 95 05/12/2023   PLT 299 05/12/2023    Lab Results  Component Value Date   NA 141 05/12/2023   K 4.9 05/12/2023  CO2 24 05/12/2023   GLUCOSE 78 05/12/2023   BUN 13 05/12/2023   CREATININE 0.65 05/12/2023   BILITOT 0.3 05/12/2023   ALKPHOS 76 05/12/2023   AST 16 05/12/2023   ALT 13 05/12/2023   PROT 6.1 05/12/2023   ALBUMIN 3.7 (L) 05/12/2023   CALCIUM 9.3 05/12/2023   ANIONGAP 10 11/23/2014   EGFR 99 05/12/2023   Lab Results  Component Value Date   CHOL 177 05/12/2023   Lab Results  Component Value Date   HDL 67 05/12/2023   Lab Results  Component Value Date   LDLCALC 96 05/12/2023   Lab Results  Component Value Date   TRIG 74 05/12/2023   Lab Results  Component Value Date   CHOLHDL 2.6 05/12/2023   Lab Results  Component Value Date   HGBA1C 5.1 05/12/2023      Assessment & Plan:  Other cerebral palsy (HCC) Assessment & Plan: The patient was seen today with her caregiver No complaints or concerns voiced today   Orders: -     UNABLE TO FIND; Med Name:  Transport chair G80.9  Dispense: 1 each; Refill: 0  Anxiety Assessment & Plan: Stable with no complaints or concerns voiced   Other osteoporosis without current pathological fracture -     Prolia ; Inject 60 mg into the skin every 6 (six) months.  Dispense: 1 mL; Refill: 4  Note: This chart has been completed using Engineer, Civil (consulting) software, and while attempts have been made to ensure accuracy, certain words and phrases may not be transcribed as intended.    Follow-up: Return in about 4 months (around 07/05/2024).   Chevelle Durr  Z Bacchus, FNP

## 2024-03-07 NOTE — Patient Instructions (Addendum)
 I appreciate the opportunity to provide care to you today!    Follow up:  4 months  Labs:  next visit  For a Healthier YOU, I Recommend: Reducing your intake of sugar, sodium, carbohydrates, and saturated fats. Increasing your fiber intake by incorporating more whole grains, fruits, and vegetables into your meals. Setting healthy goals with a focus on lowering your consumption of carbs, sugar, and unhealthy fats. Adding variety to your diet by including a wide range of fruits and vegetables. Cutting back on soda and limiting processed foods as much as possible. Staying active: In addition to taking your weight loss medication, aim for at least 150 minutes of moderate-intensity physical activity each week for optimal results.    Please follow up if your symptoms worsen or fail to improve.     Please continue to a heart-healthy diet and increase your physical activities. Try to exercise for at least five days a week.    It was a pleasure to see you and I look forward to continuing to work together on your health and well-being. Please do not hesitate to call the office if you need care or have questions about your care.  In case of emergency, please visit the Emergency Department for urgent care, or contact our clinic at [phone] to schedule an appointment. We're here to help you!   Have a wonderful day and week. With Gratitude, Meade JENEANE Gerlach MSN, FNP-BC, PMHNP-BC

## 2024-03-11 NOTE — Assessment & Plan Note (Signed)
 Stable with no complaints or concerns voiced

## 2024-03-11 NOTE — Assessment & Plan Note (Signed)
 The patient was seen today with her caregiver No complaints or concerns voiced today

## 2024-04-03 ENCOUNTER — Other Ambulatory Visit: Payer: Self-pay | Admitting: Family Medicine

## 2024-04-08 ENCOUNTER — Other Ambulatory Visit: Payer: Self-pay | Admitting: Family Medicine

## 2024-04-08 ENCOUNTER — Telehealth: Payer: Self-pay | Admitting: Family Medicine

## 2024-04-08 NOTE — Telephone Encounter (Signed)
 FL2 Noted  Copied Sleeved Original placed in providers bos Copy placed upfront Pls contact pt when form is ready and faxed

## 2024-04-16 ENCOUNTER — Other Ambulatory Visit: Payer: Self-pay | Admitting: Family Medicine

## 2024-04-22 ENCOUNTER — Telehealth: Payer: Self-pay | Admitting: Family Medicine

## 2024-04-22 NOTE — Telephone Encounter (Signed)
"  Faxed this morning   "

## 2024-04-22 NOTE — Telephone Encounter (Signed)
 Completed and faxed.

## 2024-04-22 NOTE — Telephone Encounter (Signed)
 Copied from CRM #[id number]. Topic: General - Other >> Apr 22, 2024 10:29 AM Willma SAUNDERS wrote: Reason for CRM: Brittany calling to follow up on [facility] paperwork that was dropped off on 04/08/24. Is requesting a callback for an update.  Brittany can be reached at [phone]

## 2024-07-05 ENCOUNTER — Ambulatory Visit: Admitting: Family Medicine

## 2024-07-09 ENCOUNTER — Ambulatory Visit
